# Patient Record
Sex: Male | Born: 1945 | Race: White | Hispanic: No | Smoking: Former smoker
Health system: Southern US, Community
[De-identification: ages and names within clinical notes are randomized; demographics above are authoritative.]

## PROBLEM LIST (undated history)

## (undated) DIAGNOSIS — I10 Essential (primary) hypertension: Secondary | ICD-10-CM

## (undated) DIAGNOSIS — N200 Calculus of kidney: Secondary | ICD-10-CM

## (undated) DIAGNOSIS — I2510 Atherosclerotic heart disease of native coronary artery without angina pectoris: Secondary | ICD-10-CM

## (undated) DIAGNOSIS — S129XXA Fracture of neck, unspecified, initial encounter: Secondary | ICD-10-CM

## (undated) DIAGNOSIS — E739 Lactose intolerance, unspecified: Secondary | ICD-10-CM

## (undated) DIAGNOSIS — K219 Gastro-esophageal reflux disease without esophagitis: Secondary | ICD-10-CM

## (undated) DIAGNOSIS — I219 Acute myocardial infarction, unspecified: Secondary | ICD-10-CM

## (undated) DIAGNOSIS — E785 Hyperlipidemia, unspecified: Secondary | ICD-10-CM

## (undated) DIAGNOSIS — N401 Enlarged prostate with lower urinary tract symptoms: Secondary | ICD-10-CM

## (undated) DIAGNOSIS — D500 Iron deficiency anemia secondary to blood loss (chronic): Secondary | ICD-10-CM

## (undated) DIAGNOSIS — Z9289 Personal history of other medical treatment: Secondary | ICD-10-CM

## (undated) DIAGNOSIS — I77810 Thoracic aortic ectasia: Secondary | ICD-10-CM

## (undated) DIAGNOSIS — Z87442 Personal history of urinary calculi: Secondary | ICD-10-CM

## (undated) DIAGNOSIS — C801 Malignant (primary) neoplasm, unspecified: Secondary | ICD-10-CM

## (undated) HISTORY — DX: Atherosclerotic heart disease of native coronary artery without angina pectoris: I25.10

## (undated) HISTORY — DX: Essential (primary) hypertension: I10

## (undated) HISTORY — PX: CORONARY STENT PLACEMENT: SHX1402

## (undated) HISTORY — DX: Iron deficiency anemia secondary to blood loss (chronic): D50.0

## (undated) HISTORY — DX: Lactose intolerance, unspecified: E73.9

## (undated) HISTORY — DX: Thoracic aortic ectasia: I77.810

## (undated) HISTORY — DX: Hyperlipidemia, unspecified: E78.5

## (undated) HISTORY — DX: Benign prostatic hyperplasia with lower urinary tract symptoms: N40.1

---

## 2003-09-04 ENCOUNTER — Inpatient Hospital Stay (HOSPITAL_COMMUNITY): Admission: EM | Admit: 2003-09-04 | Discharge: 2003-09-07 | Payer: Self-pay | Admitting: Emergency Medicine

## 2010-07-20 ENCOUNTER — Inpatient Hospital Stay (HOSPITAL_COMMUNITY)
Admission: EM | Admit: 2010-07-20 | Discharge: 2010-07-28 | DRG: 237 | Disposition: A | Discharge status: Home Health | Payer: Non-veteran care | Attending: Cardiovascular Disease | Admitting: Cardiovascular Disease

## 2010-07-20 ENCOUNTER — Inpatient Hospital Stay (HOSPITAL_COMMUNITY): Payer: Non-veteran care

## 2010-07-20 DIAGNOSIS — R570 Cardiogenic shock: Secondary | ICD-10-CM

## 2010-07-20 DIAGNOSIS — E7800 Pure hypercholesterolemia, unspecified: Secondary | ICD-10-CM | POA: Diagnosis present

## 2010-07-20 DIAGNOSIS — E876 Hypokalemia: Secondary | ICD-10-CM | POA: Diagnosis present

## 2010-07-20 DIAGNOSIS — Z87891 Personal history of nicotine dependence: Secondary | ICD-10-CM

## 2010-07-20 DIAGNOSIS — N179 Acute kidney failure, unspecified: Secondary | ICD-10-CM | POA: Diagnosis present

## 2010-07-20 DIAGNOSIS — E872 Acidosis, unspecified: Secondary | ICD-10-CM | POA: Diagnosis present

## 2010-07-20 DIAGNOSIS — L259 Unspecified contact dermatitis, unspecified cause: Secondary | ICD-10-CM | POA: Diagnosis not present

## 2010-07-20 DIAGNOSIS — J9600 Acute respiratory failure, unspecified whether with hypoxia or hypercapnia: Secondary | ICD-10-CM

## 2010-07-20 DIAGNOSIS — I252 Old myocardial infarction: Secondary | ICD-10-CM

## 2010-07-20 DIAGNOSIS — I2582 Chronic total occlusion of coronary artery: Secondary | ICD-10-CM | POA: Diagnosis present

## 2010-07-20 DIAGNOSIS — I469 Cardiac arrest, cause unspecified: Secondary | ICD-10-CM | POA: Diagnosis present

## 2010-07-20 DIAGNOSIS — I219 Acute myocardial infarction, unspecified: Secondary | ICD-10-CM

## 2010-07-20 DIAGNOSIS — I2510 Atherosclerotic heart disease of native coronary artery without angina pectoris: Secondary | ICD-10-CM | POA: Diagnosis present

## 2010-07-20 DIAGNOSIS — E785 Hyperlipidemia, unspecified: Secondary | ICD-10-CM | POA: Diagnosis present

## 2010-07-20 DIAGNOSIS — I2109 ST elevation (STEMI) myocardial infarction involving other coronary artery of anterior wall: Principal | ICD-10-CM | POA: Diagnosis present

## 2010-07-20 DIAGNOSIS — I10 Essential (primary) hypertension: Secondary | ICD-10-CM | POA: Diagnosis present

## 2010-07-20 DIAGNOSIS — I4901 Ventricular fibrillation: Secondary | ICD-10-CM | POA: Diagnosis present

## 2010-07-20 DIAGNOSIS — D649 Anemia, unspecified: Secondary | ICD-10-CM | POA: Diagnosis present

## 2010-07-20 DIAGNOSIS — N400 Enlarged prostate without lower urinary tract symptoms: Secondary | ICD-10-CM | POA: Diagnosis present

## 2010-07-20 LAB — BASIC METABOLIC PANEL
BUN: 17 mg/dL (ref 6–23)
BUN: 19 mg/dL (ref 6–23)
CO2: 23 mEq/L (ref 19–32)
Calcium: 6.7 mg/dL — ABNORMAL LOW (ref 8.4–10.5)
Chloride: 106 mEq/L (ref 96–112)
Creatinine, Ser: 1.71 mg/dL — ABNORMAL HIGH (ref 0.4–1.5)
Creatinine, Ser: 1.94 mg/dL — ABNORMAL HIGH (ref 0.4–1.5)
GFR calc Af Amer: 49 mL/min — ABNORMAL LOW (ref >60)
GFR calc Af Amer: 42 mL/min — ABNORMAL LOW (ref >60)
GFR calc non Af Amer: 41 mL/min — ABNORMAL LOW (ref >60)
Potassium: 3 mEq/L — ABNORMAL LOW (ref 3.5–5.1)

## 2010-07-20 LAB — GLUCOSE, CAPILLARY
Glucose-Capillary: 441 mg/dL — ABNORMAL HIGH (ref 70–99)
Glucose-Capillary: 454 mg/dL — ABNORMAL HIGH (ref 70–99)

## 2010-07-20 LAB — POCT I-STAT 3, ART BLOOD GAS (G3+)
Acid-base deficit: 7 mmol/L — ABNORMAL HIGH (ref 0.0–2.0)
O2 Saturation: 96 %
TCO2: 22 mmol/L (ref 0–100)

## 2010-07-20 LAB — CBC
HCT: 41.4 % (ref 39.0–52.0)
Hemoglobin: 14.6 g/dL (ref 13.0–17.0)
MCHC: 35.3 g/dL (ref 30.0–36.0)
MCV: 87.2 fL (ref 78.0–100.0)
Platelets: 327 10*3/uL (ref 150–400)
RBC: 4.75 MIL/uL (ref 4.22–5.81)
RDW: 13.3 % (ref 11.5–15.5)
WBC: 30.8 10*3/uL — ABNORMAL HIGH (ref 4.0–10.5)

## 2010-07-20 LAB — BLOOD GAS, ARTERIAL
Drawn by: 32470
FIO2: 0.8 %
O2 Saturation: 99.8 %
pCO2 arterial: 37.4 mmHg (ref 35.0–45.0)

## 2010-07-20 LAB — PHOSPHORUS: Phosphorus: 1.4 mg/dL — ABNORMAL LOW (ref 2.3–4.6)

## 2010-07-20 LAB — PROTIME-INR
INR: 1.16 (ref 0.00–1.49)
Prothrombin Time: 15 seconds (ref 11.6–15.2)

## 2010-07-20 LAB — APTT: aPTT: 136 seconds — ABNORMAL HIGH (ref 24–37)

## 2010-07-20 LAB — MAGNESIUM: Magnesium: 1.8 mg/dL (ref 1.5–2.5)

## 2010-07-20 LAB — HEPARIN LEVEL (UNFRACTIONATED): Heparin Unfractionated: 0.61 IU/mL (ref 0.30–0.70)

## 2010-07-21 ENCOUNTER — Inpatient Hospital Stay (HOSPITAL_COMMUNITY): Payer: Non-veteran care

## 2010-07-21 DIAGNOSIS — I2109 ST elevation (STEMI) myocardial infarction involving other coronary artery of anterior wall: Secondary | ICD-10-CM

## 2010-07-21 LAB — GLUCOSE, CAPILLARY
Glucose-Capillary: 137 mg/dL — ABNORMAL HIGH (ref 70–99)
Glucose-Capillary: 158 mg/dL — ABNORMAL HIGH (ref 70–99)
Glucose-Capillary: 167 mg/dL — ABNORMAL HIGH (ref 70–99)
Glucose-Capillary: 196 mg/dL — ABNORMAL HIGH (ref 70–99)
Glucose-Capillary: 235 mg/dL — ABNORMAL HIGH (ref 70–99)
Glucose-Capillary: 236 mg/dL — ABNORMAL HIGH (ref 70–99)
Glucose-Capillary: 266 mg/dL — ABNORMAL HIGH (ref 70–99)
Glucose-Capillary: 306 mg/dL — ABNORMAL HIGH (ref 70–99)
Glucose-Capillary: 313 mg/dL — ABNORMAL HIGH (ref 70–99)

## 2010-07-21 LAB — CBC
HCT: 34.1 % — ABNORMAL LOW (ref 39.0–52.0)
Hemoglobin: 11.9 g/dL — ABNORMAL LOW (ref 13.0–17.0)
WBC: 19.4 10*3/uL — ABNORMAL HIGH (ref 4.0–10.5)

## 2010-07-21 LAB — BASIC METABOLIC PANEL
BUN: 20 mg/dL (ref 6–23)
Calcium: 7 mg/dL — ABNORMAL LOW (ref 8.4–10.5)
GFR calc non Af Amer: 36 mL/min — ABNORMAL LOW (ref >60)
Potassium: 3.1 mEq/L — ABNORMAL LOW (ref 3.5–5.1)
Sodium: 142 mEq/L (ref 135–145)

## 2010-07-21 LAB — BLOOD GAS, ARTERIAL
Bicarbonate: 24.6 mEq/L — ABNORMAL HIGH (ref 20.0–24.0)
O2 Saturation: 99.4 %
Patient temperature: 100.6
pH, Arterial: 7.475 — ABNORMAL HIGH (ref 7.350–7.450)
pO2, Arterial: 143 mmHg — ABNORMAL HIGH (ref 80.0–100.0)

## 2010-07-21 LAB — LACTIC ACID, PLASMA: Lactic Acid, Venous: 2.3 mmol/L — ABNORMAL HIGH (ref 0.5–2.2)

## 2010-07-21 LAB — CARDIAC PANEL(CRET KIN+CKTOT+MB+TROPI)
CK, MB: 432.1 ng/mL (ref 0.3–4.0)
Total CK: 6609 U/L — ABNORMAL HIGH (ref 7–232)

## 2010-07-21 LAB — CARBOXYHEMOGLOBIN
O2 Saturation: 55 %
Total hemoglobin: 10.5 g/dL — ABNORMAL LOW (ref 13.5–18.0)

## 2010-07-21 LAB — CK TOTAL AND CKMB (NOT AT ARMC): CK, MB: 147.1 ng/mL (ref 0.3–4.0)

## 2010-07-21 LAB — HEPARIN LEVEL (UNFRACTIONATED): Heparin Unfractionated: 0.31 IU/mL (ref 0.30–0.70)

## 2010-07-21 LAB — MAGNESIUM: Magnesium: 1.7 mg/dL (ref 1.5–2.5)

## 2010-07-21 LAB — TROPONIN I: Troponin I: 91.29 ng/mL (ref 0.00–0.06)

## 2010-07-21 NOTE — Procedures (Signed)
NAMEDERWIN, REDDY                 ACCOUNT NO.:  [id number]  MEDICAL RECORD NO.:  [id number]           PATIENT TYPE:  I  LOCATION:  2902                         FACILITY:  [facility]  PHYSICIAN:  Verne Carrow, MDDATE OF BIRTH:  05/07/1946  DATE OF PROCEDURE:  07/20/2010 DATE OF DISCHARGE:                           CARDIAC CATHETERIZATION   PROCEDURES PERFORMED: 1. Salvage left heart catheterization. 2. Selective coronary angiography. 3. PTCA with placement of a bare-metal stent in the proximal left     anterior descending artery. 4. Thrombectomy of the proximal and mid left anterior descending     artery with an Expressway thrombectomy catheter. 5. Thrombectomy of the mid left anterior descending artery with an     AngioJet thrombectomy catheter. 6. Placement of an intra-aortic balloon pump in the left femoral     artery. 7. Placement of a temporary pacemaker in the right femoral vein.  OPERATOR:  Verne Carrow, MD  ARTERIAL ACCESS:  Right femoral artery for the catheterization.  Right femoral vein for the temporary pacemaker.  Left femoral artery for the balloon pump.  INDICATION:  This is a 65 year old Caucasian male with a history of coronary artery disease who called EMS around 11 a.m.  The patient had complaints of chest pain and inability to breathe.  The patient was approximately 45-50 minutes away from the [facility] when the EMS crew picked him up.  In route, the patient had several cardiac arrest with ventricular fibrillation requiring CPR and defibrillation. When the patient arrived in the [facility] Emergency Department, he was pulseless with ventricular fibrillation and CPR was in progress.  CPR was performed for approximately 20 minutes in the emergency department with multiple rounds of epinephrine as well as defibrillation.  After prolonged code in the emergency department, consideration was given to removal of care.  With one final shot,  the patient had return of idioventricular rhythm with a faint pulse.  We elected to bring him to the cath lab for a salvage left heart catheterization with possible percutaneous coronary intervention.  Upon arriving in the cath lab, the patient was pulseless again with ventricular fibrillation.  CPR was started and patient was shocked again.  Multiple rounds of epinephrine, atropine, and bicarbonate were given.  Initial pH was 6.9.  The patient was intubated and on the ventilator at this time.  The patient was started on dopamine 20 mcg/kg/minute and Levophed 15 mcg per minute.  He was also started on an epinephrine drip.  We were able to get a rhythm back here in the cath lab and elected to attempt a percutaneous coronary intervention.  Arterial access was obtained through the right femoral artery.  A 6-French sheath was placed without difficulty.  A 6-French XBLAD 3.5 guiding catheter was selectively engaged into the left main artery.  Angiography demonstrated total occlusion of the proximal left anterior descending artery.  The patient was given a bolus of heparin. He was given two boluses of Integrilin and started on a drip.  I was able to pass a Cougar intracoronary wire down the left anterior descending artery.  A 2.5 x 12-mm  balloon was inflated in the area of total occlusion twice.  Flow was reestablished into the left anterior descending artery, however, there was a heavy thrombus burden.  An Expressway wire thrombectomy catheter was passed several times down the vessel.  An AngioJet thrombectomy catheter was passed multiple times down the vessel.  A 3.0 x 15-mm Vision bare-metal stent was then deployed in the proximal left anterior descending artery.  A 3.25 x 12- mm noncompliant balloon was inflated inside the stented segment.  The proximal stenosis was taken from 100% to 0%.  At this time, there was flow down to the proximal portion of the distal segment of the LAD.   We reestablished flow into two diagonal branches and three septal perforating branches.  It appeared that there was some spasm of the distal vessel.  400 mcg of verapamil was given intracoronary.  400 mcg of nitroglycerin was given intracoronary.  With final angiography, there was excellent flow through the left anterior descending artery until there was abrupt occlusion throughout the diffusely diseased segment toward the apex of the vessel.  We elected to stop at this time.  The patient's blood pressure responded to the opening in the vessel. Epinephrine drip was stopped.  At this point, we performed angiography of the native right coronary artery.  We placed a pigtail catheter into the left ventricle to measure pressures.  Of note, an intra-aortic balloon pump was placed at the beginning portion of the diagnostic procedure.  This was maintained during the case.  The patient did not require any further defibrillatory shock after the percutaneous intervention was started.  The patient was responsive on the cath table, however was given some sedation.  Of note, a temporary pacer was also placed into the right ventricle before the AngioJet thrombectomy was performed.  His temporary pacemaker was removed before the patient left cath lab.  HEMODYNAMIC FINDINGS:  Central aortic pressure 55/40.  Left ventricular pressure 72/21.  Left ventricular end-diastolic pressure 32.  ANGIOGRAPHIC FINDINGS: 1. The left main coronary artery had no disease. 2. The left anterior descending had 100% proximal occlusion. 3. The circumflex artery gave off two moderate-sized obtuse marginal     branches.  There was diffuse plaque, but no heavily stenotic     lesions. 4. The right coronary artery was a very large dominant vessel with     serial 30% lesions throughout the proximal and mid vessel. 5. No left ventricular angiogram was performed.  IMPRESSION: 1. Cardiac arrest with prolonged cardiopulmonary  resuscitation     secondary to anterior ST-segment elevation myocardial infarction. 2. Occluded proximal left anterior descending artery. 3. Successful salvage percutaneous transluminal coronary angioplasty     with placement of bare-metal stent in the left anterior descending     artery. 4. Cardiogenic shock.  RECOMMENDATIONS:  The patient will be taken to the CCU.  We will continue his intra-aortic balloon pump at 1:1.  We will continue his Levophed and dopamine drips.  We will continue his sodium bicarbonate drip.  We will load him with 60 mg of Effient through his NG tube.  He will be continued on aspirin 325 mg once daily and will be given a statin medication.  We will not start a beta-blocker at this time with his hemodynamic instability.  Pulmonary Critical Care Medicine has been consulted for potential cooling protocol, although this will have to be well thought out as the patient currently seems to be somewhat responsive.     Verne Carrow, MD  CM/MEDQ  D:  07/20/2010  T:  07/21/2010  Job:  [id number]  Electronically Signed by Verne Carrow MD on 07/21/2010 05:42:22 PM

## 2010-07-22 ENCOUNTER — Inpatient Hospital Stay (HOSPITAL_COMMUNITY): Payer: Non-veteran care

## 2010-07-22 DIAGNOSIS — I2109 ST elevation (STEMI) myocardial infarction involving other coronary artery of anterior wall: Secondary | ICD-10-CM

## 2010-07-22 LAB — POCT I-STAT 3, ART BLOOD GAS (G3+)
Bicarbonate: 27.4 mEq/L — ABNORMAL HIGH (ref 20.0–24.0)
O2 Saturation: 97 %
Patient temperature: 98.9
Patient temperature: 97.2
TCO2: 29 mmol/L (ref 0–100)
pCO2 arterial: 30.3 mmHg — ABNORMAL LOW (ref 35.0–45.0)
pH, Arterial: 7.58 — ABNORMAL HIGH (ref 7.350–7.450)
pH, Arterial: 7.548 — ABNORMAL HIGH (ref 7.350–7.450)

## 2010-07-22 LAB — BASIC METABOLIC PANEL
BUN: 17 mg/dL (ref 6–23)
BUN: 21 mg/dL (ref 6–23)
CO2: 28 mEq/L (ref 19–32)
Calcium: 6.5 mg/dL — ABNORMAL LOW (ref 8.4–10.5)
Chloride: 104 mEq/L (ref 96–112)
Creatinine, Ser: 1.34 mg/dL (ref 0.4–1.5)
Creatinine, Ser: 1.32 mg/dL (ref 0.4–1.5)
GFR calc Af Amer: >60 mL/min (ref >60)
GFR calc non Af Amer: 54 mL/min — ABNORMAL LOW (ref >60)
Glucose, Bld: 137 mg/dL — ABNORMAL HIGH (ref 70–99)

## 2010-07-22 LAB — CBC
HCT: 27.6 % — ABNORMAL LOW (ref 39.0–52.0)
RDW: 13.8 % (ref 11.5–15.5)
WBC: 15.6 10*3/uL — ABNORMAL HIGH (ref 4.0–10.5)

## 2010-07-22 LAB — HEMOGLOBIN A1C
Hgb A1c MFr Bld: 6 % — ABNORMAL HIGH (ref <5.7)
Mean Plasma Glucose: 126 mg/dL — ABNORMAL HIGH (ref <117)

## 2010-07-22 LAB — GLUCOSE, CAPILLARY
Glucose-Capillary: 114 mg/dL — ABNORMAL HIGH (ref 70–99)
Glucose-Capillary: 132 mg/dL — ABNORMAL HIGH (ref 70–99)
Glucose-Capillary: 137 mg/dL — ABNORMAL HIGH (ref 70–99)
Glucose-Capillary: 138 mg/dL — ABNORMAL HIGH (ref 70–99)
Glucose-Capillary: 138 mg/dL — ABNORMAL HIGH (ref 70–99)
Glucose-Capillary: 139 mg/dL — ABNORMAL HIGH (ref 70–99)
Glucose-Capillary: 140 mg/dL — ABNORMAL HIGH (ref 70–99)
Glucose-Capillary: 148 mg/dL — ABNORMAL HIGH (ref 70–99)
Glucose-Capillary: 149 mg/dL — ABNORMAL HIGH (ref 70–99)
Glucose-Capillary: 154 mg/dL — ABNORMAL HIGH (ref 70–99)
Glucose-Capillary: 158 mg/dL — ABNORMAL HIGH (ref 70–99)
Glucose-Capillary: 159 mg/dL — ABNORMAL HIGH (ref 70–99)
Glucose-Capillary: 164 mg/dL — ABNORMAL HIGH (ref 70–99)

## 2010-07-22 LAB — MAGNESIUM: Magnesium: 1.6 mg/dL (ref 1.5–2.5)

## 2010-07-22 LAB — PHOSPHORUS: Phosphorus: 3.1 mg/dL (ref 2.3–4.6)

## 2010-07-23 DIAGNOSIS — I214 Non-ST elevation (NSTEMI) myocardial infarction: Secondary | ICD-10-CM

## 2010-07-23 LAB — POCT I-STAT 3, ART BLOOD GAS (G3+)
Bicarbonate: 25.3 mEq/L — ABNORMAL HIGH (ref 20.0–24.0)
O2 Saturation: 82 %
Patient temperature: 98.6
TCO2: 13 mmol/L (ref 0–100)
pCO2 arterial: 53.1 mmHg — ABNORMAL HIGH (ref 35.0–45.0)
pCO2 arterial: 71.2 mmHg (ref 35.0–45.0)
pH, Arterial: 6.913 — CL (ref 7.350–7.450)
pH, Arterial: 6.95 — CL (ref 7.350–7.450)
pH, Arterial: 7.472 — ABNORMAL HIGH (ref 7.350–7.450)
pO2, Arterial: 76 mmHg — ABNORMAL LOW (ref 80.0–100.0)
pO2, Arterial: 69 mmHg — ABNORMAL LOW (ref 80.0–100.0)

## 2010-07-23 LAB — GLUCOSE, CAPILLARY
Glucose-Capillary: 133 mg/dL — ABNORMAL HIGH (ref 70–99)
Glucose-Capillary: 94 mg/dL (ref 70–99)
Glucose-Capillary: 104 mg/dL — ABNORMAL HIGH (ref 70–99)

## 2010-07-23 LAB — CBC
Hemoglobin: 8.3 g/dL — ABNORMAL LOW (ref 13.0–17.0)
MCH: 30.1 pg (ref 26.0–34.0)
Platelets: 148 10*3/uL — ABNORMAL LOW (ref 150–400)
RBC: 2.76 MIL/uL — ABNORMAL LOW (ref 4.22–5.81)
WBC: 14.1 10*3/uL — ABNORMAL HIGH (ref 4.0–10.5)

## 2010-07-23 LAB — COMPREHENSIVE METABOLIC PANEL
ALT: 169 U/L — ABNORMAL HIGH (ref 0–53)
Albumin: 2.4 g/dL — ABNORMAL LOW (ref 3.5–5.2)
Alkaline Phosphatase: 42 U/L (ref 39–117)
BUN: 19 mg/dL (ref 6–23)
Calcium: 6.5 mg/dL — ABNORMAL LOW (ref 8.4–10.5)
Potassium: 3.2 mEq/L — ABNORMAL LOW (ref 3.5–5.1)
Sodium: 136 mEq/L (ref 135–145)
Total Protein: 4.6 g/dL — ABNORMAL LOW (ref 6.0–8.3)

## 2010-07-23 LAB — MAGNESIUM: Magnesium: 1.8 mg/dL (ref 1.5–2.5)

## 2010-07-23 LAB — POCT ACTIVATED CLOTTING TIME: Activated Clotting Time: 116 seconds

## 2010-07-23 LAB — PHOSPHORUS: Phosphorus: 2.2 mg/dL — ABNORMAL LOW (ref 2.3–4.6)

## 2010-07-23 LAB — HEPARIN LEVEL (UNFRACTIONATED): Heparin Unfractionated: <0.1 IU/mL — ABNORMAL LOW (ref 0.30–0.70)

## 2010-07-24 ENCOUNTER — Inpatient Hospital Stay (HOSPITAL_COMMUNITY): Payer: Non-veteran care

## 2010-07-24 LAB — GLUCOSE, CAPILLARY
Glucose-Capillary: 103 mg/dL — ABNORMAL HIGH (ref 70–99)
Glucose-Capillary: 110 mg/dL — ABNORMAL HIGH (ref 70–99)
Glucose-Capillary: 110 mg/dL — ABNORMAL HIGH (ref 70–99)
Glucose-Capillary: 124 mg/dL — ABNORMAL HIGH (ref 70–99)
Glucose-Capillary: 98 mg/dL (ref 70–99)

## 2010-07-24 LAB — COMPREHENSIVE METABOLIC PANEL
ALT: 115 U/L — ABNORMAL HIGH (ref 0–53)
AST: 79 U/L — ABNORMAL HIGH (ref 0–37)
Albumin: 2.4 g/dL — ABNORMAL LOW (ref 3.5–5.2)
CO2: 24 mEq/L (ref 19–32)
Calcium: 7.4 mg/dL — ABNORMAL LOW (ref 8.4–10.5)
Chloride: 108 mEq/L (ref 96–112)
Creatinine, Ser: 1.07 mg/dL (ref 0.4–1.5)
GFR calc Af Amer: >60 mL/min (ref >60)
GFR calc non Af Amer: >60 mL/min (ref >60)
Sodium: 141 mEq/L (ref 135–145)

## 2010-07-24 LAB — CBC
Hemoglobin: 8.1 g/dL — ABNORMAL LOW (ref 13.0–17.0)
MCHC: 34.8 g/dL (ref 30.0–36.0)
Platelets: 141 10*3/uL — ABNORMAL LOW (ref 150–400)
RBC: 2.63 MIL/uL — ABNORMAL LOW (ref 4.22–5.81)

## 2010-07-25 DIAGNOSIS — I469 Cardiac arrest, cause unspecified: Secondary | ICD-10-CM

## 2010-07-25 LAB — COMPREHENSIVE METABOLIC PANEL
ALT: 90 U/L — ABNORMAL HIGH (ref 0–53)
BUN: 16 mg/dL (ref 6–23)
CO2: 24 mEq/L (ref 19–32)
Calcium: 7.8 mg/dL — ABNORMAL LOW (ref 8.4–10.5)
Creatinine, Ser: 1.02 mg/dL (ref 0.4–1.5)
GFR calc non Af Amer: >60 mL/min (ref >60)
Glucose, Bld: 84 mg/dL (ref 70–99)
Total Protein: 5.2 g/dL — ABNORMAL LOW (ref 6.0–8.3)

## 2010-07-25 LAB — CBC
HCT: 23.8 % — ABNORMAL LOW (ref 39.0–52.0)
Hemoglobin: 8.1 g/dL — ABNORMAL LOW (ref 13.0–17.0)
MCH: 29.9 pg (ref 26.0–34.0)
MCHC: 34 g/dL (ref 30.0–36.0)
MCV: 87.8 fL (ref 78.0–100.0)
RDW: 13.9 % (ref 11.5–15.5)

## 2010-07-25 LAB — FERRITIN: Ferritin: 416 ng/mL — ABNORMAL HIGH (ref 22–322)

## 2010-07-25 LAB — PHOSPHORUS: Phosphorus: 2.5 mg/dL (ref 2.3–4.6)

## 2010-07-25 LAB — FOLATE: Folate: 5.9 ng/mL

## 2010-07-25 LAB — IRON AND TIBC: Saturation Ratios: 17 % — ABNORMAL LOW (ref 20–55)

## 2010-07-25 LAB — MAGNESIUM: Magnesium: 2.1 mg/dL (ref 1.5–2.5)

## 2010-07-26 LAB — BASIC METABOLIC PANEL
BUN: 15 mg/dL (ref 6–23)
Chloride: 107 mEq/L (ref 96–112)
GFR calc non Af Amer: >60 mL/min (ref >60)
Glucose, Bld: 95 mg/dL (ref 70–99)
Potassium: 3.5 mEq/L (ref 3.5–5.1)
Sodium: 138 mEq/L (ref 135–145)

## 2010-07-26 LAB — LIPID PANEL
Cholesterol: 116 mg/dL (ref 0–200)
LDL Cholesterol: 54 mg/dL (ref 0–99)
Total CHOL/HDL Ratio: 3.3 RATIO
Triglycerides: 133 mg/dL (ref <150)

## 2010-07-27 LAB — CBC
Hemoglobin: 8.6 g/dL — ABNORMAL LOW (ref 13.0–17.0)
MCH: 29.9 pg (ref 26.0–34.0)
MCHC: 33.9 g/dL (ref 30.0–36.0)
RDW: 14.8 % (ref 11.5–15.5)

## 2010-07-28 LAB — CBC
MCH: 30 pg (ref 26.0–34.0)
MCV: 88.5 fL (ref 78.0–100.0)
Platelets: 338 10*3/uL (ref 150–400)
RDW: 15.2 % (ref 11.5–15.5)
WBC: 8.8 10*3/uL (ref 4.0–10.5)

## 2010-07-28 LAB — BASIC METABOLIC PANEL
BUN: 14 mg/dL (ref 6–23)
Creatinine, Ser: 1.02 mg/dL (ref 0.4–1.5)
GFR calc non Af Amer: >60 mL/min (ref >60)
Potassium: 3.4 mEq/L — ABNORMAL LOW (ref 3.5–5.1)

## 2010-07-28 LAB — BRAIN NATRIURETIC PEPTIDE: Pro B Natriuretic peptide (BNP): 255 pg/mL — ABNORMAL HIGH (ref 0.0–100.0)

## 2010-08-01 ENCOUNTER — Telehealth: Payer: Self-pay | Admitting: Cardiovascular Disease

## 2010-08-01 ENCOUNTER — Encounter: Payer: Self-pay | Admitting: Cardiovascular Disease

## 2010-08-09 NOTE — H&P (Signed)
NAMEJOSON, [name]                 ACCOUNT NO.:  [id number]  MEDICAL RECORD NO.:  [id number]           PATIENT TYPE:  I  LOCATION:  2902                         FACILITY:  [facility]  PHYSICIAN:  Veverly Fells. Excell Seltzer, MD  DATE OF BIRTH:  01-31-1946  DATE OF ADMISSION:  07/20/2010 DATE OF DISCHARGE:                             HISTORY & PHYSICAL   HISTORY OF PRESENT ILLNESS:  This is a 65 year old gentleman with a history of coronary artery disease and prior MI in approximately 2004 who developed sudden onset of chest pain and shortness of breath this morning.  All of the history is obtained from his family.  The patient called 911 and upon EMS arrival, the patient had severe chest pain.  He had tombstone ST elevation on his EKG suggestive of massive anterior wall MI.  A code STEMI was called from the field and the patient was transported by EMS.  The patient developed ventricular fibrillation en route and required 2 shocks.  CPR was in progress when the patient arrived to [facility] while he was having of chest compressions.  We met the patient as he was coming off the truck and delivered another shock before he was brought into the emergency department.  CPR continued through this time.  The patient was given multiple rounds of epinephrine as well as continued CPR and multiple defibrillations.  We had a discussion with the patient's brother who was the only family member present and felt that the patient's prognosis was extremely poor at that point.  The patient was delivered a final shock and he developed a ventricular escape rhythm with a pulse.  We brought him emergently to the cath lab for a salvage procedure.  This was discussed with the patient's brother and sister-in-law while he was on the way up to the cath lab.  Dr. Clifton James escorted him to the cath lab where CPR was continued.  The patient will undergo salvage cardiac catheterization as he is clearly having a massive  anterior wall myocardial infarction.  PAST MEDICAL HISTORY: 1. CAD status post MI 2004.  Apparently the patient was treated     medically because of "small blood vessels." 2. Hypercholesterolemia. 3. Hypertension.  MEDICATIONS:  The patient's medications are unknown at this time.  He takes 3 blood pressure medicines, 1 cholesterol medicine, antacid, and a baby aspirin daily.  The specific meds are not known.  ALLERGIES:  NKDA.  SOCIAL HISTORY:  The patient works part-time at a funeral home.  He quit smoking at the time of his first MI.  He drinks alcohol on rare occasion.  FAMILY HISTORY:  His brother recently had a TIA.  His dad had an MI and stroke.  REVIEW OF SYSTEMS:  Unable to obtain.  PHYSICAL EXAMINATION:  The patient is pulseless.  He has no spontaneous respirations.  CPR is in progress.  He is cyanotic appearing.  There is no peripheral edema.  ELECTROCARDIOGRAM:  The patient initially had marked anterolateral ST- segment elevation consistent with massive myocardial infarction, again on arrival, the patient has coarse ventricular fibrillation.  ASSESSMENT:  This is a  65 year old gentleman with ventricular fibrillation arrest and pulseless electrical activity following massive anterolateral myocardial infarction with CPR in progress.  As above, the patient is being taken for salvage cardiac catheterization plus or minus PCI.  All this was discussed in detail with the patient's family. Greater than 1 hour was spent with the patient and critical care time.     Veverly Fells. Excell Seltzer, MD     MDC/MEDQ  D:  07/20/2010  T:  07/20/2010  Job:  [id number]  Electronically Signed by Tonny Bollman MD on 08/07/2010 09:10:20 PM

## 2010-08-09 NOTE — Progress Notes (Signed)
Summary: orders for home health care  Phone Note Call from Patient Call back at [phone]   Caller:  Andrea--friend of pt Summary of Call: Pt calling about in home health care Initial call taken by: Judie Grieve,  August 01, 2010 3:53 PM  Follow-up for Phone Call        Sue Lush called to let us know that [name] would not accept the pt due to insurance.  I did not release any medical information but made her aware that I would refer pt to another Home Health Agency.  Paperwork completed and faxed to [facility] for PT/OT.  Follow-up by: Julieta Gutting, RN, BSN,  August 01, 2010 6:53 PM

## 2010-08-10 NOTE — Discharge Summary (Signed)
[name], Parker                 ACCOUNT NO.:  [id number]  MEDICAL RECORD NO.:  [id number]           PATIENT TYPE:  I  LOCATION:  3707                         FACILITY:  [facility]  PHYSICIAN:  Noralyn Pick. Eden Emms, MD, FACCDATE OF BIRTH:  April 29, 1946  DATE OF ADMISSION:  07/20/2010 DATE OF DISCHARGE:  07/28/2010                              DISCHARGE SUMMARY   PRIMARY CARDIOLOGIST:  Veverly Fells. Excell Seltzer, MD.  Primary care was provided at the [facility].  DISCHARGE DIAGNOSES:  Acute anterolateral ST-segment elevation myocardial infarction.  SECONDARY DIAGNOSES: 1. Ventricular fibrillation/pulseless electrical activity arrest,     requiring prolonged CPR. 2. Cardiogenic shock requiring intraaortic balloon pump and inotropic     therapy. 3. Ventilator-dependent respiratory failure requiring intubation, now     resolved. 4. Nonoliguric acute renal failure in the setting of cardiogenic     shock. 5. Coronary artery disease with an occluded proximal LAD in this     admission successfully treated with salvage thrombectomy, PTCA, and     placement of a 3.0 x 15 mm Vision bare-metal stent. 6. Hypotension requiring inotropic support. 7. Hypokalemia requiring supplementation. 8. Hyperlipidemia. 9. Prior history of hypertension.  ALLERGIES:  No known drug allergies.  PROCEDURES: 1. Emergent diagnostic cardiac catheterization, July 20, 2010,     revealing an occluded proximal LAD with otherwise nonobstructive     disease.  The LAD was successfully treated with thrombectomy, PTCA,     and placement of 3.0 x 15 mm Vision bare-metal stent.  Intra-aortic     balloon pump was placed as well as a temporary transvenous     pacemaker. 2. 2-D echocardiogram, July 25, 2008.  EF 55%.  There was overall     poor endocardial visualization.  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  A     small primarily posterior pericardial effusion with no obvious     tamponade.  HISTORY OF PRESENT  ILLNESS:  A 65 year old male with prior history of MI, small vessel of coronary artery disease, who was in his usual state of health until the morning of admission when the patient had sudden onset of chest pain and dyspnea, prompting him to call 911.  Upon EMS arrival, the patient was noted to have a marked ST-segment elevation in anterolateral leads and a code STEMI was called.  The patient unfortunately developed ventricular fibrillation requiring two defibrillations as well as CPR on arrival to [facility].  The patient had recurrent ventricular fibrillation immediately upon arrival, [facility] requiring an additional defibrillation with ongoing CPR.  He received multiple rounds of epinephrine and multiple additional defibrillations.  It was felt that the patient's prognosis was poor and discussion was held with family members.  Decision was made to give one additional defibrillation, which resulted in a ventricular escape rhythm with a pulse.  This being the case, the patient was taken emergently to the Cath Lab for further evaluation.  HOSPITAL COURSE:  The patient underwent emergent diagnostic catheterization on July 20, 2010.  Of note, the patient had been intubated and sedated earlier.  Catheterization revealed subtle occlusion of the proximal  LAD, otherwise nonobstructive disease. Salvage intervention was performed with eventual placement of a 3.0 x 15 mm Vision bare-metal stent in the LAD with good angiographic result and restoration of distal flow.  Prior to leaving the Cath Lab, an intra- aortic balloon pump was placed in a ratio 1:1 and the patient was also maintained on Levophed, dopamine, and sodium bicarbonate drips.  The patient was monitored in the Coronary Intensive Care Unit and Critical Care Medicine was consulted for additional ventilator management.  He was maintained on aspirin, Effient, and eptifibatide infusion.  The eptifibatide was subsequently  discontinued on July 21, 2010.  Within 48 hours, the patient's blood pressure stabilized to the point that both norepinephrine and dopamine therapy were discontinued.  Further, his respiratory status improved and the patient was extubated on July 22, 2010.  Intra-aortic balloon pump was initially weaned down to 1-2 and then subsequently discontinued on July 23, 2010.  With hemodynamic stabilization, we are able to initiate low-dose enalapril therapy and secondary to presumed cardiomyopathy in the setting of a massive anterolateral MI, he is also placed on intravenous digoxin and oral spirolactone.  Low-dose beta- blocker was also initiated.  Followup 2-D echocardiogram was performed on July 25, 2010 showing an EF of 55%.  Overall, there was poor endocardial visualization and the apex appeared to be akinetic.  In light of normal LV function, digoxin and spirolactone were discontinued. The patient's enalapril was switched back to an oral formulation of losartan, which the patient was previously taking at home.  Carvedilol therapy was continued along with statin.  In the setting of acute MI, the patient did initially exhibited acute nonoliguric renal failure with the creatinine of 1.94 on July 20, 2010.  This has been improving on a daily basis and is 1.02 today. Further, the patient has been hypokalemic throughout admission requiring supplementation.  Finally, the patient has been anemic with an hemoglobin and hematocrit in the 8 and 24 range.  He has not required transfusion.  Anemia is felt to be multifactorial in the setting of blood loss with prolonged procedure, critical illness, and phlebotomy. He has been placed on oral iron supplementation.  The patient was transferred out to the floor and on July 26, 2010. He has been evaluated by Cardiac Rehab and has been ambulating without recurrent symptoms or limitations.  The patient is felt to be stable  for discharge today.  DISCHARGE LABS:  Hemoglobin 8.6, hematocrit 25.4, WBC 8.8, platelets 338.  Sodium 139, potassium 3.4, chloride 110, CO2 of 24, BUN 14, creatinine 1.02, glucose 106, total bilirubin 0.8, alkaline phosphatase 58, AST 59, ALT 90, total protein 5.2, albumin 2.4, calcium 8.1, phosphorus 2.5, magnesium 2.1.  Procalcitonin was 9.39 on July 21, 2010.  Hemoglobin A1c 6.0, peak CK 6609, peak MB 289.9, peak troponin-I greater than 100, total cholesterol 116, triglycerides 133, HDL 35, LDL 54.  Serum iron 39, TIBC 225, UIBC 186, vitamin B12 of 164, folate 5.9, ferritin 416.  MRSA screen was negative.  DISPOSITION:  The patient will be discharged home today in good condition.  FOLLOWUP PLANS AND APPOINTMENTS:  We will arrange for followup with Dr. Tonny Bollman in approximately 2 weeks.  The patient will follow up with [facility] as previously scheduled.  He will require follow up H and H when seen in our office.  DISCHARGE MEDICATIONS: 1. Carvedilol 6.25 mg b.i.d. 2. Ferrous sulfate 325 mg daily. 3. Hydrocodone/APAP 5/325 mg q.6 h p.r.n. chest wall pain. 4. Nitroglycerin 0.4  mg SL prn chest pain. 5. Effient 10 mg daily. 6. Losartan 50 mg daily. 7. Aspirin 81 mg daily. 8. Omeprazole 20 mg daily. 9. Pravastatin 40 mg at bedtime. 10.Potassium chloride 20 mEq daily.  OUTSTANDING LABORATORY STUDIES:  The patient will require follow up basic metabolic panel and CBC when seen.  DURATION OF DISCHARGE/ENCOUNTER:  Sixty minutes including physician time.     Nicolasa Ducking, ANP   ______________________________ Noralyn Pick. Eden Emms, MD, [facility]    CB/MEDQ  D:  07/28/2010  T:  07/28/2010  Job:  [id number]  cc:   [facility]  Electronically Signed by Nicolasa Ducking ANP on 08/07/2010 04:12:09 PM Electronically Signed by Charlton Haws MD [facility] on 08/09/2010 09:07:24 AM

## 2010-08-13 DIAGNOSIS — E876 Hypokalemia: Secondary | ICD-10-CM | POA: Insufficient documentation

## 2010-08-13 DIAGNOSIS — N138 Other obstructive and reflux uropathy: Secondary | ICD-10-CM

## 2010-08-13 DIAGNOSIS — N401 Enlarged prostate with lower urinary tract symptoms: Secondary | ICD-10-CM | POA: Insufficient documentation

## 2010-08-13 DIAGNOSIS — I959 Hypotension, unspecified: Secondary | ICD-10-CM | POA: Insufficient documentation

## 2010-08-13 DIAGNOSIS — Z8679 Personal history of other diseases of the circulatory system: Secondary | ICD-10-CM | POA: Insufficient documentation

## 2010-08-13 DIAGNOSIS — I4901 Ventricular fibrillation: Secondary | ICD-10-CM | POA: Insufficient documentation

## 2010-08-13 DIAGNOSIS — E785 Hyperlipidemia, unspecified: Secondary | ICD-10-CM

## 2010-08-13 DIAGNOSIS — I2510 Atherosclerotic heart disease of native coronary artery without angina pectoris: Secondary | ICD-10-CM

## 2010-08-13 DIAGNOSIS — R570 Cardiogenic shock: Secondary | ICD-10-CM | POA: Insufficient documentation

## 2010-08-13 HISTORY — DX: Benign prostatic hyperplasia with lower urinary tract symptoms: N40.1

## 2010-08-13 HISTORY — DX: Hyperlipidemia, unspecified: E78.5

## 2010-08-13 HISTORY — DX: Other obstructive and reflux uropathy: N13.8

## 2010-08-13 HISTORY — DX: Atherosclerotic heart disease of native coronary artery without angina pectoris: I25.10

## 2010-08-14 ENCOUNTER — Emergency Department (HOSPITAL_COMMUNITY): Payer: Non-veteran care

## 2010-08-14 ENCOUNTER — Encounter: Payer: Self-pay | Admitting: Physician Assistant

## 2010-08-14 ENCOUNTER — Inpatient Hospital Stay (HOSPITAL_COMMUNITY)
Admission: EM | Admit: 2010-08-14 | Discharge: 2010-08-15 | DRG: 312 | Disposition: A | Discharge status: Home / Self Care | Payer: Non-veteran care | Attending: Cardiovascular Disease | Admitting: Cardiovascular Disease

## 2010-08-14 ENCOUNTER — Other Ambulatory Visit (INDEPENDENT_AMBULATORY_CARE_PROVIDER_SITE_OTHER): Payer: Medicare Other

## 2010-08-14 ENCOUNTER — Encounter (INDEPENDENT_AMBULATORY_CARE_PROVIDER_SITE_OTHER): Payer: Medicare Other | Admitting: Physician Assistant

## 2010-08-14 ENCOUNTER — Inpatient Hospital Stay (HOSPITAL_COMMUNITY): Payer: Non-veteran care

## 2010-08-14 DIAGNOSIS — E739 Lactose intolerance, unspecified: Secondary | ICD-10-CM | POA: Insufficient documentation

## 2010-08-14 DIAGNOSIS — Z79899 Other long term (current) drug therapy: Secondary | ICD-10-CM

## 2010-08-14 DIAGNOSIS — N139 Obstructive and reflux uropathy, unspecified: Secondary | ICD-10-CM | POA: Diagnosis present

## 2010-08-14 DIAGNOSIS — I2510 Atherosclerotic heart disease of native coronary artery without angina pectoris: Secondary | ICD-10-CM

## 2010-08-14 DIAGNOSIS — D500 Iron deficiency anemia secondary to blood loss (chronic): Secondary | ICD-10-CM

## 2010-08-14 DIAGNOSIS — I2109 ST elevation (STEMI) myocardial infarction involving other coronary artery of anterior wall: Secondary | ICD-10-CM

## 2010-08-14 DIAGNOSIS — N138 Other obstructive and reflux uropathy: Secondary | ICD-10-CM | POA: Diagnosis present

## 2010-08-14 DIAGNOSIS — Z7982 Long term (current) use of aspirin: Secondary | ICD-10-CM

## 2010-08-14 DIAGNOSIS — I10 Essential (primary) hypertension: Secondary | ICD-10-CM

## 2010-08-14 DIAGNOSIS — E785 Hyperlipidemia, unspecified: Secondary | ICD-10-CM | POA: Diagnosis present

## 2010-08-14 DIAGNOSIS — R0989 Other specified symptoms and signs involving the circulatory and respiratory systems: Secondary | ICD-10-CM

## 2010-08-14 DIAGNOSIS — N401 Enlarged prostate with lower urinary tract symptoms: Secondary | ICD-10-CM | POA: Diagnosis present

## 2010-08-14 DIAGNOSIS — Z9861 Coronary angioplasty status: Secondary | ICD-10-CM

## 2010-08-14 DIAGNOSIS — R55 Syncope and collapse: Principal | ICD-10-CM | POA: Diagnosis present

## 2010-08-14 DIAGNOSIS — R7309 Other abnormal glucose: Secondary | ICD-10-CM | POA: Diagnosis present

## 2010-08-14 HISTORY — DX: Essential (primary) hypertension: I10

## 2010-08-14 HISTORY — DX: Lactose intolerance, unspecified: E73.9

## 2010-08-14 HISTORY — DX: Iron deficiency anemia secondary to blood loss (chronic): D50.0

## 2010-08-14 LAB — CARDIAC PANEL(CRET KIN+CKTOT+MB+TROPI): Relative Index: INVALID (ref 0.0–2.5)

## 2010-08-14 LAB — DIFFERENTIAL
Basophils Absolute: 0 10*3/uL (ref 0.0–0.1)
Basophils Relative: 0 % (ref 0–1)
Eosinophils Absolute: 0.2 10*3/uL (ref 0.0–0.7)
Eosinophils Relative: 1 % (ref 0–5)

## 2010-08-14 LAB — APTT: aPTT: 27 seconds (ref 24–37)

## 2010-08-14 LAB — PROTIME-INR: INR: 1.05 (ref 0.00–1.49)

## 2010-08-14 LAB — COMPREHENSIVE METABOLIC PANEL
AST: 19 U/L (ref 0–37)
BUN: 13 mg/dL (ref 6–23)
CO2: 25 mEq/L (ref 19–32)
Chloride: 106 mEq/L (ref 96–112)
Creatinine, Ser: 0.94 mg/dL (ref 0.4–1.5)
GFR calc Af Amer: >60 mL/min (ref >60)
GFR calc non Af Amer: >60 mL/min (ref >60)
Total Bilirubin: 0.8 mg/dL (ref 0.3–1.2)

## 2010-08-14 LAB — CBC
Platelets: 315 10*3/uL (ref 150–400)
RDW: 14.2 % (ref 11.5–15.5)
WBC: 11.5 10*3/uL — ABNORMAL HIGH (ref 4.0–10.5)

## 2010-08-15 DIAGNOSIS — R55 Syncope and collapse: Secondary | ICD-10-CM

## 2010-08-15 LAB — CARDIAC PANEL(CRET KIN+CKTOT+MB+TROPI)
CK, MB: 1.3 ng/mL (ref 0.3–4.0)
Troponin I: 0.03 ng/mL (ref 0.00–0.06)

## 2010-08-21 NOTE — Assessment & Plan Note (Signed)
Summary: post hospital   Visit Type:  ov   History of Present Illness: Primary Cardiologist:  Dr. Tonny Bollman  [name] is a 65 yo male with a history of CAD, status post MI in 2005 treated medically.  He presented to [facility] on 07/20/10 with an AL STEMI complicated by VFib arrest/PEA requiring prolonged CPR, cardiogenic shock, ventilator dependent respiratory failure, nonoliguric acute renal failure.  He was treated with IABP/inotropes.  After prolonged attempts at reviving him, he did develop a stable rhythm and was taken to the Cath Lab for salvage PCI.  He had a totally occluded LAD that was treated with PTCA/thrombectomy and bare-metal stent.  EF was noted to be preserved on followup echocardiogram.  Digoxin and spironolactone were both discontinued at this point.  He returns for followup.  Labs: Na 139, K 3.4, Creat 1.02, Hgb 8.6, MCV 88.5, TC 116, TG 133, HDL 35, LDL 54, AST 59, ALT 90, A1C 6, Fe 39, B12 164 (low), Folate 5.6 (ok).  He was put on Iron in the hospital.  It was thought he had multifactorial anemia, partly related to blood loss from prolonged cath and multiple blood draws.  He also required K+ repletion during his stay.  Since discharge, he is done well.  He denies any further chest discomfort.  He denies shortness of breath.  He does have some bilateral arm pain.  This is from his elbows to his wrists.  He is working with physical therapy at home.  He feels like his arms are somewhat sore.  His chest is still sore from prolonged CPR.  He denies orthopnea, PND or edema.  He denies syncope or palpitations.   Current Medications (verified): 1)  Coreg 6.25 Mg Tabs (Carvedilol) .Marland Kitchen.. 1 By Mouth Two Times A Day 2)  Iron 325 (65 Fe) Mg Tabs (Ferrous Sulfate) .Marland Kitchen.. 1 By Mouth Daily 3)  Hydrocodone-Acetaminophen 5-325 Mg Tabs (Hydrocodone-Acetaminophen) .... As Needed 4)  Nitrostat 0.4 Mg Subl (Nitroglycerin) .... As Needed 5)  Klor-Con M20 20 Meq Cr-Tabs (Potassium  Chloride Crys Cr) .Marland Kitchen.. 1 By Mouth Daily 6)  Effient 10 Mg Tabs (Prasugrel Hcl) .Marland Kitchen.. 1 By Mouth Daily 7)  Cozaar 50 Mg Tabs (Losartan Potassium) .Marland Kitchen.. 1 By Mouth Dialy 8)  Aspirin 81 Mg  Tabs (Aspirin) .Marland Kitchen.. 1 By Mouth Daily 9)  Omeprazole 20 Mg Cpdr (Omeprazole) .Marland Kitchen.. 1 By Mouth Daily 10)  Pravastatin Sodium 40 Mg Tabs (Pravastatin Sodium) .Marland Kitchen.. 1 By Mouth Daily  Allergies (verified): No Known Drug Allergies  Past History:  Past Medical History: CAD, NATIVE VESSEL (ICD-414.01)   a. s/p AL STEMI 07/20/10 . . . c/b VF/PEA; VDRF; ARF. . . tx with BMS to LAD   b. cath 2/17: CFX with diff plaque; p-mRCA with serial 30% Echo 07/25/10: EF 55% (poor endo. visualization); ?apical AK; post Eff Glucose Intol   a. A1C 6 (07/2010) Anemia HYPERTENSION, HX OF (ICD-V12.50) HYPERLIPIDEMIA (ICD-272.4) BENIGN PROSTATIC HYPERTROPHY, WITH URINARY OBSTRUCTION (ICD-600.01)  Social History: Reviewed history from 08/13/2010 and no changes required.  The patient works part-time at a funeral home.  He quit   smoking at the time of his first MI.  He drinks alcohol on rare   occasion.  [place] Vet with [employer]     Vital Signs:  Patient profile:   65 year old male Height:      66 inches Weight:      188 pounds BMI:     30.45 Pulse rate:  60 / minute Pulse rhythm:   irregular Resp:     16 per minute BP sitting:   125 / 76  (left arm) Cuff size:   large  Vitals Entered By: Marrion Coy, CNA (August 14, 2010 8:56 AM)  Physical Exam  General:  Well nourished, well developed, in no acute distress HEENT: normal Neck: no JVD Cardiac:  normal S1, S2; RRR; no murmur Lungs:  clear to auscultation bilaterally, no wheezing, rhonchi or rales Abd: soft, nontender, no hepatomegaly Ext: no  edema; RFA and LFA sites without hematoma or bruit Endo: No thyromegaly Skin: warm and dry Neuro:  CNs 2-12 intact, no focal abnormalities noted    EKG  Procedure date:  08/14/2010  Findings:      Normal Sinus  Rhythm Heart rate 60 Normal axis Low-voltage Poor R-wave progression T-wave inversions in leads 1, aVL, V1-V5; increased artifact in lead V6  Impression & Recommendations:  Problem # 1:  ACUT MI ANTEROLAT WALL EPIS CARE UNS (ICD-410.00) He is doing well post MI.  He had a very complicated course and was critically ill at one point.  He appears to be recovering nicely.  He is still working with PT at home.  When he is discharged, he has been instructed to call us so we can get him into cardiac rehab.  He lives closer to [facility] or [place]. He has some arm soreness, but this sounds MSK.  He will remain on Effient and ASA.    Problem # 2:  CAD, NATIVE VESSEL (ICD-414.01) As above.  I will give him a prescription for his Effient.  He will followup with Dr. Excell Seltzer in one month.  Problem # 3:  ANEMIA, SECONDARY TO BLOOD LOSS (ICD-280.0) Repeat CBC today.  His B12 level was low in the hospital.  However, his MCV was normal.  He's never had a history of B12 deficiency.  Also another B12 level with his CBC today.  If this is low, he'll need followup with his PCP.  Continued iron for now.  Orders: TLB-CBC Platelet - w/Differential (85025-CBCD) TLB-B12 + Folate Pnl ([id number]-U98/JXB)  Problem # 4:  ELEVATED LFTS (ICD-790.4) These were probably elevated in the hospital secondary to his acute MI.  He'll have followup LFTs today.  Problem # 5:  HYPOKALEMIA (ICD-276.8) He is on potassium replacement now.  Check a basic metabolic panel today.  Orders: TLB-BMP (Basic Metabolic Panel-BMET) (80048-METABOL) TLB-CBC Platelet - w/Differential (85025-CBCD) TLB-Hepatic/Liver Function Pnl (80076-HEPATIC) TLB-B12 + Folate Pnl ([id number]/MVH)  Problem # 6:  HYPERLIPIDEMIA (ICD-272.4) This is managed by his PCP at the [facility].  His updated medication list for this problem includes:    Pravastatin Sodium 40 Mg Tabs (Pravastatin sodium) .Marland Kitchen... 1 by mouth daily  Problem # 7:  HYPERTENSION  (ICD-401.9) Controlled.  Problem # 8:  GLUCOSE INTOLERANCE (ICD-271.3) Hemoglobin A1c was 6 in the hospital.  He should follow up with his PCP for management.  Other Orders: EKG w/ Interpretation (93000)  Patient Instructions: 1)  Your physician recommends that you schedule a follow-up appointment in: WITH DR. Excell Seltzer AS PER Sheriann Newmann, PA-C. 2)  Your physician recommends that you return for lab work in: TODAY BMET 410.70, 786.8, CBC, 280.0, 276.8, LFT 410.70, 276.8, B-12 280.0, 276.8. 3)  A WRITTEN PRESCRIPTION FOR EFFIENT HAS BEEN GIVEN TO YOU TODAY AS PER YOUR REQUEST. 4)  Your physician recommends that you continue on your current medications as directed. Please refer to the Current Medication list given to you  today. Prescriptions: EFFIENT 10 MG TABS (PRASUGREL HCL) 1 by mouth daily  #30 x 11   Entered by:   Danielle Rankin, CMA   Authorized by:   Tereso Newcomer PA-C   Signed by:   Danielle Rankin, CMA on 08/14/2010   Method used:   Print then Give to Patient   RxID:   [id number]   Appended Document: post hospital The patient began to feel poorly while his blood was being drawn.  He was brought back around to my area.  I was down in the treadmill room at the time.  The patient collapsed.  A code Erskine Emery was called.  Dr. Clifton James and Dr. Excell Seltzer both attended to him.  His pulse was in the 40s with sinus brady when he was hooked up to the monitor.  He was probably asystolic for about 10 seconds before he was put on the monitor.  His BP was 100/80.  We suspect he had a severe vagal episode with his blood draw.  He did lose control of his bladder.  Otherwise, we do not think he had any seizure-like activity.  He will be admitted for observation.  Cycle cardiac enzymes.  Check a head CT.

## 2010-08-21 NOTE — Discharge Summary (Signed)
NAMEKENNIEL, BERGSMA                 ACCOUNT NO.:  [id number]  MEDICAL RECORD NO.:  [id number]           PATIENT TYPE:  I  LOCATION:  2922                         FACILITY:  [facility]  PHYSICIAN:  Veverly Fells. Excell Seltzer, MD  DATE OF BIRTH:  [birth year]  DATE OF ADMISSION:  08/14/2010 DATE OF DISCHARGE:  08/15/2010                              DISCHARGE SUMMARY   PRIMARY CARDIOLOGIST:  Veverly Fells. Excell Seltzer, MD  PRIMARY CARE PROVIDER:  [facility].  DISCHARGE DIAGNOSIS:  Syncope, etiology most consistent with vasovagal response secondary to blood draw.  SECONDARY DIAGNOSES: 1. Ventricular fibrillation/pulseless electrical activity arrest,     requiring prolonged cardiopulmonary resuscitation on July 20, 2010.     a.     Acute anterolateral ST-segment elevation myocardial      infarction with cardiac catheterization on July 20, 2010:      Occluded proximal left anterior descending successfully treated      with salvage thrombectomy, percutaneous transluminal coronary      angioplasty, and placement of a 3.0 x 15-mm Vision bare-metal      stent. 2. History of nonoliguric acute renal failure in the setting of     cardiogenic shock during admission on July 20, 2010. 3. Hypertension. 4. Hyperlipidemia. 5. Benign prostatic hypertrophy, with urinary obstruction. 6. Glucose tolerance, hemoglobin A1c of 6 in February 2012.  ALLERGIES:  No known drug allergies.  PROCEDURES/DIAGNOSTICS PERFORMED DURING HOSPITALIZATION:  None.  REASON FOR ADMISSION:  This is a 65 year old gentleman with a recent discharge from [facility] on July 28, 2010, with an anterolateral ST- segment elevation myocardial infarction, ventricular fibrillation arrest/PEA requiring prolonged CPR, cardiogenic shock, VDRF, and acute renal failure who presented to the [facility] outpatient office for post hospitalization follow up.  The patient states he had been doing well since this time.  He denies  any chest discomfort or shortness of breath, although he still has chest soreness secondary to prolonged CPR on February 17.  The patient was doing well and was to have a repeat CBC on the day of the office visit and was taken to the lab in the office for blood draw.  At this time, the tech had difficulty obtaining blood and per the patient was "fishing around."  The patient began to feel flushed and nauseated and then proceeded to have a syncopal event.  The patient denies any chest pain.  The patient denies any postictal symptoms.  With the patient recent critically ill course, it was felt the patient should be admitted overnight for observation.  HOSPITAL COURSE:  The patient was brought to the Step-Down Unit at [facility]. He has been monitored on telemetry through 24 hours now.  His telemetry did show sinus rhythm with an occasional PVC.  No further arrhythmias. The patient ruled out for myocardial infarction, again the patient denied any chest pain.  A CT of the head demonstrated no acute findings. EKG was without change.  The patient was able to ambulate without difficulty.  After observation for 24 hours and review of lab results, it was felt the patient's syncopal episode was  most consistent with a vasovagal response secondary to difficult blood draw.  At this time, there is no increase concern for arrhythmias.  Therefore, no outpatient monitoring is felt necessary.    On day of discharge, Dr. Excell Seltzer evaluated the patient and noted him stable for home with outpatient follow up. The patient has been hemodynamically stable.  DISCHARGE LABORATORY DATA:  Cardiac enzymes negative x3.  WBC 11.5, hemoglobin 12.9, hematocrit 39.4, and platelet 315.  Sodium 138, potassium 4.8, BUN 13, and creatinine 0.94.  DISCHARGE MEDICATIONS:  "Unchanged from admission meds," 1. Aspirin enteric-coated 81 mg daily. 2. Coreg 6.25 mg 1 tablet twice daily with meals. 3. Ferrous sulfate 325 mg 1 tablet  daily. 4. Vicodin 5/325 mg 1 tablet every 6 hours as needed for pain. 5. Losartan 50 mg 1 tablet daily. 6. Nitroglycerin sublingual 0.4 mg 1 tablet under tongue q.5 minutes     as needed for chest pain. 7. Omeprazole 20 mg daily. 8. Potassium chloride 20 mEq daily. 9. Pravastatin 40 mg daily. 10.Prasugrel 10 mg 1 tablet daily.  FOLLOWUP PLANS AND INSTRUCTIONS: 1. The patient will follow up with Dr. Excell Seltzer on April 12 at 10:30     a.m. 2. The patient is to increase activity slowly.  He is to stop any     activity that cause chest pain or shortness breath. 3. The patient is to continue a low-sodium, heart-healthy diet. 4. The patient is to call office in the interim if there are any     questions or concerns.   DURATION OF DISCHARGE: Greater than 30 minutes with physician and  physician extender time.   Leonette Monarch, PA-C   ______________________________ Veverly Fells. Excell Seltzer, MD    NB/MEDQ  D:  08/15/2010  T:  08/16/2010  Job:  [id number]  cc:   [facility]  Electronically Signed by Alen Blew P.A. on 08/20/2010 11:01:33 AM Electronically Signed by Tonny Bollman MD on 08/21/2010 06:00:59 PM

## 2010-08-30 NOTE — Letter (Signed)
[facility]   Imported By: Marylou Mccoy 08/20/2010 14:36:11  _____________________________________________________________________  External Attachment:    Type:   Image     Comment:   External Document

## 2010-09-07 ENCOUNTER — Encounter: Payer: Self-pay | Admitting: Cardiovascular Disease

## 2010-09-13 ENCOUNTER — Ambulatory Visit (INDEPENDENT_AMBULATORY_CARE_PROVIDER_SITE_OTHER): Payer: Medicare Other | Admitting: Cardiovascular Disease

## 2010-09-13 ENCOUNTER — Encounter: Payer: Self-pay | Admitting: Cardiovascular Disease

## 2010-09-13 DIAGNOSIS — E785 Hyperlipidemia, unspecified: Secondary | ICD-10-CM

## 2010-09-13 DIAGNOSIS — I10 Essential (primary) hypertension: Secondary | ICD-10-CM

## 2010-09-13 DIAGNOSIS — I4901 Ventricular fibrillation: Secondary | ICD-10-CM

## 2010-09-13 DIAGNOSIS — I2510 Atherosclerotic heart disease of native coronary artery without angina pectoris: Secondary | ICD-10-CM

## 2010-09-13 NOTE — Patient Instructions (Signed)
Your physician recommends that you schedule a follow-up appointment in: 6 WEEKS  Your physician has requested that you have an echocardiogram in 6 WEEKS. Echocardiography is a painless test that uses sound waves to create images of your heart. It provides your doctor with information about the size and shape of your heart and how well your heart's chambers and valves are working. This procedure takes approximately one hour. There are no restrictions for this procedure.  Your physician recommends that you continue on your current medications as directed. Please refer to the Current Medication list given to you today.

## 2010-09-23 ENCOUNTER — Encounter: Payer: Self-pay | Admitting: Cardiovascular Disease

## 2010-09-23 NOTE — Progress Notes (Signed)
HPI:  [name] is a 65 yo male with a history of CAD, status post MI in 2005 treated medically. He presented to [facility] on 07/20/10 with an AL STEMI complicated by VFib arrest/PEA requiring prolonged CPR, cardiogenic shock, ventilator dependent respiratory failure, nonoliguric acute renal failure. He was treated with IABP/inotropes. After prolonged attempts at reviving him, he did develop a stable rhythm and was taken to the Cath Lab for salvage PCI. He had a totally occluded LAD that was treated with PTCA/thrombectomy and bare-metal stent. EF was noted to be preserved on followup echocardiogram. He was seen in follow-up in February and had a syncopal event that occurred with phlebotomy and he required a 23 hour observation admission.  The patient is doing better. He reports fatigue, but denies chest pain or pressure. He denies dyspnea, orthopnea, or PND. No palpitations, lightheadedness, or recurrent syncope.    Outpatient Encounter Prescriptions as of 09/13/2010  Medication Sig Dispense Refill  . aspirin 81 MG tablet Take 81 mg by mouth daily.        . carvedilol (COREG) 6.25 MG tablet Take 6.25 mg by mouth 2 (two) times daily with a meal.        . ferrous sulfate 325 (65 FE) MG EC tablet Take 325 mg by mouth daily with breakfast.        . losartan (COZAAR) 50 MG tablet Take 50 mg by mouth daily.        . nitroGLYCERIN (NITROSTAT) 0.4 MG SL tablet Place 0.4 mg under the tongue every 5 (five) minutes as needed.        Marland Kitchen omeprazole (PRILOSEC) 20 MG capsule Take 20 mg by mouth daily.        . potassium chloride SA (K-DUR,KLOR-CON) 20 MEQ tablet Take 20 mEq by mouth daily.        . prasugrel (EFFIENT) 10 MG TABS Take 10 mg by mouth daily.        . pravastatin (PRAVACHOL) 40 MG tablet Take 40 mg by mouth daily.        Marland Kitchen DISCONTD: HYDROcodone-acetaminophen (NORCO) 5-325 MG per tablet Take 1 tablet by mouth every 6 (six) hours as needed.          No Known Allergies  Past Medical History    Diagnosis Date  . CAD, NATIVE VESSEL 08/13/2010  . GLUCOSE INTOLERANCE 08/14/2010  . ANEMIA, SECONDARY TO BLOOD LOSS 08/14/2010  . HYPERLIPIDEMIA 08/13/2010  . HYPERTENSION 08/14/2010  . BENIGN PROSTATIC HYPERTROPHY, WITH URINARY OBSTRUCTION 08/13/2010    ROS: Negative except as per HPI  BP 138/76  Pulse 54  Resp 18  Ht 5\' 6"  (1.676 m)  Wt 183 lb 12 oz (83.348 kg)  BMI 29.66 kg/m2  PHYSICAL EXAM: Pt is alert and oriented, overweight male in NAD HEENT: normal Neck: JVP - normal, carotids 2+= without bruits Lungs: CTA bilaterally CV: RRR without murmur or gallop Abd: soft, NT, Positive BS, no hepatomegaly Ext: no C/C/E, distal pulses intact and equal Skin: warm/dry no rash  EKG: Sinus brady 54 bpm, low voltage QRS, T wave abnoramlity consider anterior ischemia.  ASSESSMENT AND PLAN:

## 2010-09-23 NOTE — Assessment & Plan Note (Signed)
The patient is slowly improving after his complicated MI. He is on a stable medical regimen. We discussed strategies at increasing exercise with hopes of improving overall energy level. Recommend repeat a 2D echo when he follows up in 6 weeks. His prior echo study had poor endocardial visualization and I think accurate assessment of his LVEF is important (it was estimated at 55% by that study).

## 2010-09-23 NOTE — Assessment & Plan Note (Signed)
LDL at goal of less than 70 mg/dL on pravastatin.

## 2010-09-23 NOTE — Assessment & Plan Note (Signed)
BP well-controlled on combination of carvedilol and losartan.

## 2010-10-17 ENCOUNTER — Encounter: Payer: Self-pay | Admitting: Cardiovascular Disease

## 2010-10-17 ENCOUNTER — Ambulatory Visit (HOSPITAL_COMMUNITY): Payer: Medicare Other | Attending: Cardiovascular Disease | Admitting: Radiology

## 2010-10-17 ENCOUNTER — Ambulatory Visit (INDEPENDENT_AMBULATORY_CARE_PROVIDER_SITE_OTHER): Payer: Medicare Other | Admitting: Cardiovascular Disease

## 2010-10-17 DIAGNOSIS — I252 Old myocardial infarction: Secondary | ICD-10-CM | POA: Insufficient documentation

## 2010-10-17 DIAGNOSIS — I2510 Atherosclerotic heart disease of native coronary artery without angina pectoris: Secondary | ICD-10-CM

## 2010-10-17 DIAGNOSIS — E785 Hyperlipidemia, unspecified: Secondary | ICD-10-CM

## 2010-10-17 DIAGNOSIS — I059 Rheumatic mitral valve disease, unspecified: Secondary | ICD-10-CM | POA: Insufficient documentation

## 2010-10-17 DIAGNOSIS — I10 Essential (primary) hypertension: Secondary | ICD-10-CM | POA: Insufficient documentation

## 2010-10-17 DIAGNOSIS — I4901 Ventricular fibrillation: Secondary | ICD-10-CM

## 2010-10-17 DIAGNOSIS — I079 Rheumatic tricuspid valve disease, unspecified: Secondary | ICD-10-CM | POA: Insufficient documentation

## 2010-10-17 NOTE — Progress Notes (Signed)
HPI:  [name] is a 65 yo male with a history of CAD, status post MI in 2005 treated medically. He presented to [facility] on 07/20/10 with an AL STEMI complicated by VFib arrest/PEA requiring prolonged CPR, cardiogenic shock, ventilator dependent respiratory failure, nonoliguric acute renal failure. He was treated with primary PCI, IABP, and inotropes. He had a totally occluded LAD that was treated with PTCA/thrombectomy and bare-metal stent.   The patient presents today for followup evaluation. He has been doing well with no chest pain or dyspnea. He has been slowly increasing his activity level. He denies lightheadedness, palpitations, or syncope. He had a followup 2-D echocardiogram today. He reports compliance with his medications.  Outpatient Encounter Prescriptions as of 10/17/2010  Medication Sig Dispense Refill  . aspirin 81 MG tablet Take 81 mg by mouth daily.        . carvedilol (COREG) 6.25 MG tablet Take 6.25 mg by mouth 2 (two) times daily with a meal.        . ferrous sulfate 325 (65 FE) MG EC tablet Take 325 mg by mouth daily with breakfast.        . losartan (COZAAR) 50 MG tablet Take 50 mg by mouth daily.        . nitroGLYCERIN (NITROSTAT) 0.4 MG SL tablet Place 0.4 mg under the tongue every 5 (five) minutes as needed.        . potassium chloride SA (K-DUR,KLOR-CON) 20 MEQ tablet Take 20 mEq by mouth daily.        . prasugrel (EFFIENT) 10 MG TABS Take 10 mg by mouth daily.        . pravastatin (PRAVACHOL) 40 MG tablet Take 40 mg by mouth daily.        Marland Kitchen omeprazole (PRILOSEC) 20 MG capsule Take 20 mg by mouth daily.          No Known Allergies  Past Medical History  Diagnosis Date  . CAD, NATIVE VESSEL 08/13/2010  . GLUCOSE INTOLERANCE 08/14/2010  . ANEMIA, SECONDARY TO BLOOD LOSS 08/14/2010  . HYPERLIPIDEMIA 08/13/2010  . HYPERTENSION 08/14/2010  . BENIGN PROSTATIC HYPERTROPHY, WITH URINARY OBSTRUCTION 08/13/2010    ROS: Negative except as per HPI  BP 122/68   Pulse 52  Resp 18  Ht 5\' 6"  (1.676 m)  Wt 184 lb (83.462 kg)  BMI 29.70 kg/m2  PHYSICAL EXAM: Pt is alert and oriented, NAD HEENT: normal Neck: JVP - normal, carotids 2+= without bruits Lungs: CTA bilaterally CV: RRR without murmur or gallop Abd: soft, NT, Positive BS, no hepatomegaly Ext: no C/C/E, distal pulses intact and equal Skin: warm/dry no rash  ASSESSMENT AND PLAN:

## 2010-10-17 NOTE — Patient Instructions (Signed)
Your physician recommends that you continue on your current medications as directed. Please refer to the Current Medication list given to you today.  Your physician recommends that you schedule a follow-up appointment in: 3 MONTHS  

## 2010-10-30 ENCOUNTER — Encounter: Payer: Self-pay | Admitting: Cardiovascular Disease

## 2010-10-30 NOTE — Assessment & Plan Note (Signed)
Lipids at goal on pravastatin. The patient's LDL was less than 70 at last check. Recent lipid panel was reviewed.

## 2010-10-30 NOTE — Assessment & Plan Note (Signed)
The patient is stable without angina. His echocardiogram was reviewed and shows normalization of LV function which really is remarkable considering his presentation.  Will continue his current medical program which includes aspirin, effient, losartan, and carvedilol.

## 2010-10-30 NOTE — Assessment & Plan Note (Signed)
Blood pressure is well controlled on his current medical program. 

## 2010-11-26 ENCOUNTER — Telehealth: Payer: Self-pay | Admitting: Cardiovascular Disease

## 2010-11-26 NOTE — Telephone Encounter (Signed)
All Cardiac faxed to [facility] @ [phone] 11/26/10/km

## 2011-01-14 ENCOUNTER — Telehealth: Payer: Self-pay | Admitting: Cardiovascular Disease

## 2011-01-14 NOTE — Telephone Encounter (Signed)
Per pt call pt was having chest pain and sob over the weekend. Pt said he took one nitro, with relief. Pt said BP and HR are back to normal. Transferred call to [place].

## 2011-01-14 NOTE — Telephone Encounter (Signed)
Patient called to let Dr. Excell Seltzer know that this past Saturday  about 9:00 PM he had chest pain and pressure score of "8" and SOB. B/P 137/87 patient took one NTG SL with relief. He states that day he had worked all day out side in hot temperature, does not know if that caused the chest pain. Today he is feeling fine no C/O B/P 130/93 to 125/84 pulse 66 beats/minute. Patient has an appointment with Dr. Excell Seltzer on August 28 th 2012. He would like to know if MD would like to see him sooner.

## 2011-01-15 ENCOUNTER — Inpatient Hospital Stay (HOSPITAL_COMMUNITY)
Admission: EM | Admit: 2011-01-15 | Discharge: 2011-01-17 | DRG: 247 | Disposition: A | Discharge status: Home / Self Care | Payer: Medicare Other | Source: Ambulatory Visit | Attending: Cardiovascular Disease | Admitting: Cardiovascular Disease

## 2011-01-15 ENCOUNTER — Emergency Department (HOSPITAL_COMMUNITY): Payer: Medicare Other

## 2011-01-15 DIAGNOSIS — I214 Non-ST elevation (NSTEMI) myocardial infarction: Principal | ICD-10-CM | POA: Diagnosis present

## 2011-01-15 DIAGNOSIS — I2510 Atherosclerotic heart disease of native coronary artery without angina pectoris: Secondary | ICD-10-CM | POA: Diagnosis present

## 2011-01-15 DIAGNOSIS — E739 Lactose intolerance, unspecified: Secondary | ICD-10-CM | POA: Diagnosis present

## 2011-01-15 DIAGNOSIS — Z7902 Long term (current) use of antithrombotics/antiplatelets: Secondary | ICD-10-CM

## 2011-01-15 DIAGNOSIS — Z79899 Other long term (current) drug therapy: Secondary | ICD-10-CM

## 2011-01-15 DIAGNOSIS — Z823 Family history of stroke: Secondary | ICD-10-CM

## 2011-01-15 DIAGNOSIS — R079 Chest pain, unspecified: Secondary | ICD-10-CM

## 2011-01-15 DIAGNOSIS — I10 Essential (primary) hypertension: Secondary | ICD-10-CM | POA: Diagnosis present

## 2011-01-15 DIAGNOSIS — Z7982 Long term (current) use of aspirin: Secondary | ICD-10-CM

## 2011-01-15 DIAGNOSIS — Z8249 Family history of ischemic heart disease and other diseases of the circulatory system: Secondary | ICD-10-CM

## 2011-01-15 DIAGNOSIS — Z9861 Coronary angioplasty status: Secondary | ICD-10-CM

## 2011-01-15 DIAGNOSIS — E785 Hyperlipidemia, unspecified: Secondary | ICD-10-CM | POA: Diagnosis present

## 2011-01-15 DIAGNOSIS — I252 Old myocardial infarction: Secondary | ICD-10-CM

## 2011-01-15 DIAGNOSIS — N400 Enlarged prostate without lower urinary tract symptoms: Secondary | ICD-10-CM | POA: Diagnosis present

## 2011-01-15 DIAGNOSIS — I959 Hypotension, unspecified: Secondary | ICD-10-CM | POA: Diagnosis not present

## 2011-01-15 DIAGNOSIS — Z87891 Personal history of nicotine dependence: Secondary | ICD-10-CM

## 2011-01-15 LAB — COMPREHENSIVE METABOLIC PANEL
ALT: 34 U/L (ref 0–53)
AST: 30 U/L (ref 0–37)
Alkaline Phosphatase: 65 U/L (ref 39–117)
CO2: 26 mEq/L (ref 19–32)
Chloride: 106 mEq/L (ref 96–112)
GFR calc non Af Amer: >60 mL/min (ref >60)
Sodium: 139 mEq/L (ref 135–145)
Total Bilirubin: 0.5 mg/dL (ref 0.3–1.2)

## 2011-01-15 LAB — CBC
MCV: 84.8 fL (ref 78.0–100.0)
Platelets: 267 10*3/uL (ref 150–400)
RBC: 5.21 MIL/uL (ref 4.22–5.81)
WBC: 8.6 10*3/uL (ref 4.0–10.5)

## 2011-01-15 LAB — APTT: aPTT: 28 seconds (ref 24–37)

## 2011-01-15 LAB — POCT I-STAT TROPONIN I

## 2011-01-15 LAB — DIFFERENTIAL
Basophils Absolute: 0 10*3/uL (ref 0.0–0.1)
Basophils Relative: 0 % (ref 0–1)
Eosinophils Absolute: 0.1 10*3/uL (ref 0.0–0.7)
Lymphs Abs: 2.2 10*3/uL (ref 0.7–4.0)
Neutrophils Relative %: 65 % (ref 43–77)

## 2011-01-15 LAB — PROTIME-INR: INR: 1.09 (ref 0.00–1.49)

## 2011-01-15 NOTE — Telephone Encounter (Signed)
I spoke with the patient on the telephone. He has had 2 episodes of chest pain now, both responsive to nitroglycerin. He has not had any chest pain today. Considering his extensive cardiac history, I advised him to go to the emergency department for evaluation and admission to the hospital. He will probably need a cardiac catheterization for definitive evaluation.

## 2011-01-15 NOTE — Telephone Encounter (Signed)
Pt calling back to speak to nurse. Please return pt call.

## 2011-01-15 NOTE — Telephone Encounter (Signed)
Spoke with pt who reports he had another episode of chest pain last night. Relieved with 1 NTG. No pain today.  I told pt I would review with Dr. Excell Seltzer and call him back today.

## 2011-01-16 DIAGNOSIS — I2510 Atherosclerotic heart disease of native coronary artery without angina pectoris: Secondary | ICD-10-CM

## 2011-01-16 LAB — BASIC METABOLIC PANEL
BUN: 17 mg/dL (ref 6–23)
Chloride: 108 mEq/L (ref 96–112)
Creatinine, Ser: 0.78 mg/dL (ref 0.50–1.35)
GFR calc Af Amer: >60 mL/min (ref >60)

## 2011-01-16 LAB — CBC
MCV: 85.1 fL (ref 78.0–100.0)
Platelets: 235 10*3/uL (ref 150–400)
RDW: 14.5 % (ref 11.5–15.5)
WBC: 7.5 10*3/uL (ref 4.0–10.5)

## 2011-01-16 LAB — CARDIAC PANEL(CRET KIN+CKTOT+MB+TROPI)
CK, MB: 4.4 ng/mL — ABNORMAL HIGH (ref 0.3–4.0)
Relative Index: INVALID (ref 0.0–2.5)
Troponin I: 1.77 ng/mL (ref <0.30)

## 2011-01-16 LAB — HEPARIN LEVEL (UNFRACTIONATED): Heparin Unfractionated: 0.42 IU/mL (ref 0.30–0.70)

## 2011-01-16 LAB — POCT ACTIVATED CLOTTING TIME: Activated Clotting Time: 474 seconds

## 2011-01-17 DIAGNOSIS — I214 Non-ST elevation (NSTEMI) myocardial infarction: Secondary | ICD-10-CM

## 2011-01-17 LAB — BASIC METABOLIC PANEL
CO2: 25 mEq/L (ref 19–32)
Chloride: 108 mEq/L (ref 96–112)
Glucose, Bld: 97 mg/dL (ref 70–99)
Potassium: 3.7 mEq/L (ref 3.5–5.1)
Sodium: 140 mEq/L (ref 135–145)

## 2011-01-17 LAB — CBC
Hemoglobin: 13.1 g/dL (ref 13.0–17.0)
RBC: 4.49 MIL/uL (ref 4.22–5.81)
WBC: 6.7 10*3/uL (ref 4.0–10.5)

## 2011-01-17 LAB — DIFFERENTIAL
Basophils Absolute: 0 10*3/uL (ref 0.0–0.1)
Basophils Relative: 0 % (ref 0–1)
Monocytes Relative: 9 % (ref 3–12)
Neutro Abs: 3.9 10*3/uL (ref 1.7–7.7)
Neutrophils Relative %: 58 % (ref 43–77)

## 2011-01-17 LAB — CARDIAC PANEL(CRET KIN+CKTOT+MB+TROPI)
CK, MB: 3.9 ng/mL (ref 0.3–4.0)
Total CK: 61 U/L (ref 7–232)
Troponin I: 1.74 ng/mL (ref <0.30)

## 2011-01-25 NOTE — Discharge Summary (Addendum)
NAMEJAMESYN, LINDELL NO.:  [id number]  MEDICAL RECORD NO.:  [id number]  LOCATION:  2916                         FACILITY:  [facility]  PHYSICIAN:  Veverly Fells. Excell Seltzer, MD  DATE OF BIRTH:  April 11, 1946  DATE OF ADMISSION:  01/15/2011 DATE OF DISCHARGE:  01/17/2011                              DISCHARGE SUMMARY   DISCHARGE DIAGNOSES: 1. Non-ST segment elevation myocardial infarction. 2. Coronary artery disease.     a.     Status post PTCA and drug-eluting stent placement to the      proximal left anterior descending, January 16, 2011, felt to be      culprit vessel for non-ST segment elevation myocardial infarction.     b.     Acute anterolateral ST-segment elevation myocardial      infarction with cardiac catheterization, July 20, 2010,      demonstrating occluded proximal left anterior descending,      successfully treated with salvage thrombectomy, PTCA, and bare-      metal stent placement, complicated by ventricular fibrillation/PEA      arrest, requiring prolonged CPR. 3. Mild left ventricular dysfunction with overall preserved left     ventricular ejection fraction of 50% by cath January 16, 2011. 4. Hypertension, with hypotension this admission, losartan stopped. 5. Hyperlipidemia. 6. Benign prostatic hyperplasia with history of urinary obstruction. 7. History of nonoliguric acute renal failure in the setting of     cardiogenic shock, February 2012, with normalization of his renal     function since then. 8. History of glucose intolerance.  HOSPITAL COURSE:  Mr. Huskins is a 65 year old gentleman with a history of acute anterolateral MI in February of this year, complicated by V-Fib, cardiogenic shock, and subsequent renal failure and respiratory failure. He did quite well and was discharged approximately 10 days later. Repeat ultrasound, May 2012, demonstrated normal LV function.  He has been doing well until this admission.  He developed chest  pain associated with some shortness of breath, persisting for several hours. Nitroglycerin helped relieve the pain, but he became concerned, so came to the ER.  There, cardiac markers were found to be positive with a troponin peak of 1.77.  Otherwise, lab work was fairly unremarkable.  He was referred for cardiac catheterization, January 16, 2011, demonstrating severe proximal LAD stenosis with successful PCI using a single drug- eluting stent.  Otherwise, he had nonobstructive circumflex and RCA stenosis.  The patient was observed overnight and did well.  Today is free of complaints of chest pain or shortness of breath.  Dr. Excell Seltzer seen and examined him today and feels he is stable for discharge.  DISCHARGE LABS:  WBC 6.7, hemoglobin 13.1, hematocrit 38.6, platelet count 227.  Sodium 140, potassium 3.7, chloride 108, CO2 25, glucose 97, BUN 13, creatinine 0.73.  LFTs were normal on August 14.  TSH 1.506, peak troponin was 1.77.  STUDIES: 1. Chest x-ray, January 15, 2011, showed small calcified granuloma     within the left upper lobe.  No evidence for active chest disease. 2. Cardiac catheterization, January 16, 2011, please see full report     for details as well as HPI for  summary.  DISCHARGE MEDICATIONS: 1. Aspirin 81 mg daily. 2. Coreg 6.25 mg b.i.d. 3. Nitroglycerin sublingual 0.4 mg every 5 minutes as needed up to     three doses for chest pain. 4. Omeprazole 20 mg daily at bedtime. 5. Potassium chloride 20 mEq daily. 6. Pravastatin 40 mg at bedtime. 7. Effient 10 mg daily.  Losartan was stopped because of hypotension this admission.  DISPOSITION:  Mr. Hiney will be discharged in stable condition to home. He is not to lift anything over 5 pounds for 1 week, drive for 2 days, or participate in sexual activity for 1 week.  He is to follow a low- sodium heart-healthy diet, and call or return if he notices any pain, swelling, bleeding or pus at his cath site.  He will follow up  with Dr. Excell Seltzer as scheduled January 21, 2011 at 9 a.m. and may return to work on January 23, 2011.  DURATION OF DISCHARGE ENCOUNTER:  Greater than 30 minutes including physician and PA time.     Ronie Spies, P.A.C.   ______________________________ Veverly Fells. Excell Seltzer, MD    DD/MEDQ  D:  01/17/2011  T:  01/17/2011  Job:  [id number]  cc:   Veverly Fells. Excell Seltzer, MD  Electronically Signed by Ronie Spies  on 01/25/2011 07:52:08 AM Electronically Signed by Tonny Bollman MD on 02/06/2011 11:33:47 PM

## 2011-01-28 NOTE — H&P (Signed)
NAMEJESPER, Parker NO.:  [id number]  MEDICAL RECORD NO.:  [id number]  LOCATION:  2916                         FACILITY:  [facility]  PHYSICIAN:  Duke Salvia, MD, FACCDATE OF BIRTH:  12-22-1945  DATE OF ADMISSION:  01/15/2011 DATE OF DISCHARGE:                             HISTORY & PHYSICAL   [name] is seen in the emergency room at the request of the emergency room physicians because of chest pain.  This gentleman is a 65 year old man who had an acute anterolateral infarction in February of this year secondary to occlusion of his left anterior descending with subsequent salvage PTCA and stenting.  There was accompanied thrombectomy.  He needed intraaortic balloons pump support as his course was further complicated by ventricular fibrillation, cardiogenic shock, and subsequent renal failure and respiratory failure.  He did surprisingly well and was discharged about 10 days later and a repeat ultrasound in May 2012 demonstrated normal left ventricular function.  He has had no significant symptoms of chest pain or shortness of breath.  On Saturday night, he developed chest pain that was associated with some shortness of breath.  It persisted for a couple of hours.  He took some nitroglycerin overtime and it resolved.  Last night, he had recurrent chest pain and took nitroglycerin again. It lasted about an hour.  It was accompanied again by some shortness of breath.  He called the office today and was instructed to come to the emergency room.  His cholesterol levels have been at goal.  He does not smoke.  He has been compliant with his medications.  There has been no intercurrent URI.  He does a history of hypercholesterolemia, hypertension, and prostatic hypertrophy.  SOCIAL HISTORY:  He is here with his girlfriend.  He works part time at a funeral home.  He does not smoke.  He uses alcohol rarely.  FAMILY HISTORY:  Notable for coronary artery  disease and prior stroke.  MEDICATIONS: 1. Coreg 6.25 b.i.d. 2. Iron. 3. Effient 10. 4. Cozaar 50. 5. Aspirin 81. 6. Omeprazole 20. 7. Pravastatin 40.  He has no known drug allergies.  REVIEW OF SYSTEMS:  Broadly negative apart from what is outlined previously.  PHYSICAL EXAMINATION:  GENERAL:  He is an older Caucasian gentleman, appearing his stated age of 64.  He is in no acute distress and he denied chest pain. VITAL SIGNS:  His blood pressure was 96/57.  His pulse was 62.  His respirations were 16 and unlabored. HEENT:  Normal. NECK:  His carotids are brisk and full bilaterally without bruits.  JVP was flat. BACK:  Without kyphosis or scoliosis. LUNGS:  Clear. HEART:  Heart sounds were regular.  There was an S4.  There was no significant murmurs. ABDOMEN:  Soft with active bowel sounds.  There was no hepatomegaly. Femoral pulses were 2+.  Distal pulses were intact.  There was no clubbing, cyanosis, or edema. NEUROLOGICAL:  Grossly normal. SKIN:  Warm and dry.  Electrocardiogram dated today demonstrated sinus rhythm at 62 with intervals of 0.14/0.08/0.38.  There are two done, one at 1500 hours and one at 1600 hours, they were both similar.  They were  compared with electrocardiogram from May, which demonstrated persistent T-wave inversions in the anterior precordium.  He also has P-pulmonale.  Other laboratories today were notable for troponin on arrival of 1.75 with his last troponin in March being 0.03.  Troponin #2 was 1.55. Metabolic profile was normal including creatinine of 0.99 with creatinine clearance of greater than 60.  IMPRESSION: 1. Non-ST-segment elevation myocardial infarction. 2. Prior anterolateral myocardial infarction with an left anterior     descending occlusion complicated by cardiac arrest, cardiogenic     shock, renal failure, and respiratory failure, but now with an     ejection fraction that was normal. 3. Hypertension. 4.  Anemia.  DISCUSSION:  [name] has acute non-ST-segment elevation MI. Undoubtedly, his stent is patent given his electrocardiogram.  He had nonobstructive residual disease, so I am not quite sure where the source of his ischemia is.  We will treat him with heparin tonight in addition to his Effient.  He should have near maximal platelet inhibition.  We will continue his low-dose beta-blockers up-titration which is precluded by his relative bradycardia.  We will anticipate catheterization in the morning.     Duke Salvia, MD, [facility]     SCK/MEDQ  D:  01/15/2011  T:  01/16/2011  Job:  [id number]  Electronically Signed by Sherryl Manges MD [facility] on 01/28/2011 01:53:50 PM

## 2011-01-29 ENCOUNTER — Encounter: Payer: Self-pay | Admitting: Cardiovascular Disease

## 2011-01-29 ENCOUNTER — Ambulatory Visit (INDEPENDENT_AMBULATORY_CARE_PROVIDER_SITE_OTHER): Payer: Medicare Other | Admitting: Cardiovascular Disease

## 2011-01-29 DIAGNOSIS — I2510 Atherosclerotic heart disease of native coronary artery without angina pectoris: Secondary | ICD-10-CM

## 2011-01-29 DIAGNOSIS — E785 Hyperlipidemia, unspecified: Secondary | ICD-10-CM

## 2011-01-29 DIAGNOSIS — I10 Essential (primary) hypertension: Secondary | ICD-10-CM

## 2011-01-29 MED ORDER — ZOLPIDEM TARTRATE 5 MG PO TABS: 5.0000 mg | ORAL_TABLET | Freq: Every evening | ORAL | Status: DC | PRN

## 2011-01-29 NOTE — Patient Instructions (Signed)
Your physician recommends that you schedule a follow-up appointment in: 3 months with Dr. Cooper.  

## 2011-02-06 NOTE — Cardiovascular Report (Signed)
NAMEBROLY, HATFIELD NO.:  [id number]  MEDICAL RECORD NO.:  [id number]  LOCATION:  2916                         FACILITY:  [facility]  PHYSICIAN:  Veverly Fells. Excell Seltzer, MD  DATE OF BIRTH:  1945/08/27  DATE OF PROCEDURE:  01/16/2011 DATE OF DISCHARGE:                           CARDIAC CATHETERIZATION   PROCEDURES: 1. Left heart catheterization. 2. Selective coronary angiography. 3. Left ventricular angiography. 4. Percutaneous transluminal coronary angioplasty and stenting of the     left anterior descending. 5. Perclose to the right femoral artery.  SURGEON:  Veverly Fells. Excell Seltzer, MD  Hilarie Fredrickson INDICATIONS:  [name] is a 65 year old gentleman with coronary disease.  He presented with an anterior infarct and V-fib arrest several months ago.  He was treated with bare metal stenting of the proximal LAD during his salvage PCI procedure at the time of his cardiac arrest.  The patient had initially done well but has developed chest pain over the last week.  He came to the hospital and ruled in for myocardial infarction.  He had recurrent pain this morning and is brought in for urgent cardiac catheterization.  He has been on nitroglycerin, heparin, aspirin, and Effient.  Risks and indications of the procedure were reviewed with the patient and informed consent was obtained.  The right groin was prepped, draped, and anesthetized with 1% lidocaine.  Using modified Seldinger technique, a 5-French sheath was placed in the right femoral artery.  Standard Judkins catheters were used for coronary angiography and left ventriculography.  At the completion of the diagnostic procedure, I elected to proceed with PCI.  DIAGNOSTIC FINDINGS:  Aortic pressure 99/57 with a mean of 73, left ventricular pressure 110/16.  Coronary angiography:  The left main is patent.  It divides into the LAD and left circumflex.  There is no obstructive disease.  LAD:  The LAD is a large-caliber  vessel.  There is a critical ulcerated plaque in the proximal LAD at the proximal edge of the previously implanted stent.  There is an associated 95-99% stenosis with TIMI 2 flow in the apical portion of the LAD.  The first and second septal perforating branches have 80% ostial stenoses.  The first diagonal branch has an 80% ostial stenosis and it is moderate in caliber.  The second diagonal branch is patent.  The LAD does reach the left ventricular apex.  Left circumflex:  The left circumflex is patent.  It supplies a proximal first OM without significant stenosis.  There is an atrial branch arising from the mid circumflex.  At the origin of the first OM branch, there is a 50% stenosis present.  Right coronary artery:  The RCA is patent.  There is 20-30% diffuse proximal irregularity as well as mid irregularity.  The origin of the acute marginal branch has a 50% stenosis.  The PDA and posterolateral branches are patent without significant stenosis.  Left ventriculography:  There is apical hypokinesis.  The basal and mid anterior walls are vigorous.  The inferior wall is vigorous.  The ejection fraction is estimated at 50%.  PERCUTANEOUS CORONARY INTERVENTION NOTE:  The 5-French sheath in the right femoral artery was upsized to a 6-French.  Angiomax was used for anticoagulation.  An XB LAD 3.5 cm guide catheter was inserted.  A Cougar guidewire was advanced into the mid-LAD.  After a therapeutic ACT was achieved, the proximal LAD lesion was predilated with a 2.5 x 15 mm Sprinter Legend balloon to 8 atmospheres on 2 inflations.  There was severe proximal stenosis, in-stent restenosis, and moderate stenosis off the distal edge of the stent.  I decided to treat the entire area with a 3.0 x 28 mm Promus element stent.  The stent was carefully positioned and deployed at 12 atmospheres, so that there was full lesion coverage. The stent was postdilated with a 3.25 x 20 mm [employer] TREK balloon  which was taken to 14 atmospheres distally and 18 atmospheres proximally.  The proximal stent was again postdilated with a 3.5 x 12 mm  TREK balloon to 16 atmospheres as it appeared to be a large vessel in the proximal aspect.  There was an excellent result with 0% residual stenosis and TIMI 3 flow in the vessel.  FINAL CONCLUSIONS: 1. Severe proximal left anterior descending stenosis with successful     percutaneous intervention using a single drug-eluting stent. 2. Nonobstructive left circumflex and right coronary artery stenoses. 3. Mild left ventricular dysfunction with overall preserved left     ventricular ejection fraction.  RECOMMENDATIONS:  The patient should continue with aggressive medical therapy and dual antiplatelet therapy with aspirin and Effient for a minimum of 12 months.     Veverly Fells. Excell Seltzer, MD     MDC/MEDQ  D:  01/16/2011  T:  01/17/2011  Job:  [id number]  Electronically Signed by Tonny Bollman MD on 02/06/2011 11:33:53 PM

## 2011-02-12 ENCOUNTER — Telehealth: Payer: Self-pay | Admitting: Cardiovascular Disease

## 2011-02-12 NOTE — Telephone Encounter (Signed)
Pt calling needing surgical clearance to get elbow surgery on September 25 th. Please contact Dr. Teressa Senter to give surgical clearance [phone])

## 2011-02-12 NOTE — Telephone Encounter (Signed)
Spoke with pt and let him know Dr. Excell Seltzer would be in office later this week and could complete necessary paperwork at that time for surgery and I would fax it to Dr. Stark Jock office when complete.  Dr. Stark Jock fax number is [phone].

## 2011-02-15 ENCOUNTER — Encounter: Payer: Self-pay | Admitting: Cardiovascular Disease

## 2011-02-15 NOTE — Telephone Encounter (Signed)
Office note from last visit with Dr. Excell Seltzer faxed to Dr. Stark Jock office.

## 2011-02-15 NOTE — Assessment & Plan Note (Signed)
Well-controlled on current medications. He brings in home blood pressure readings which all show excellent control with heart rates in the 50s and 60s and blood pressure ranging from 112-126/70-84.

## 2011-02-15 NOTE — Progress Notes (Signed)
HPI:  Mr. Caselli returns for followup evaluation. He is a 65 year old gentleman status post anterior infarct and out of hospital arrest he was treated with a bare-metal stent in the proximal LAD in February of this year. He presented earlier this month with non-ST elevation infarction after experiencing recurrent chest pain and pressure. Cardiac catheterization demonstrated severe proximal LAD stenosis on the proximal edge of his previously placed stent. He was treated with a drug-eluting stent platform with an excellent result. He presents for followup evaluation today.  The patient has had no recurrent chest pain or pressure. He does have episodic shortness of breath which is unchanged. He continues to sleep poorly and he feels fatigued. He denies leg edema, orthopnea, palpitations, or PND. His main complaint is related to pain and numbness in his right hand. 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  Outpatient Encounter Prescriptions as of 01/29/2011  Medication Sig Dispense Refill  . aspirin 81 MG tablet Take 81 mg by mouth daily.        . carvedilol (COREG) 6.25 MG tablet Take 6.25 mg by mouth 2 (two) times daily with a meal.        . nitroGLYCERIN (NITROSTAT) 0.4 MG SL tablet Place 0.4 mg under the tongue every 5 (five) minutes as needed.        Marland Kitchen omeprazole (PRILOSEC) 20 MG capsule Take 20 mg by mouth daily.        . potassium chloride SA (K-DUR,KLOR-CON) 20 MEQ tablet Take 20 mEq by mouth daily.        . prasugrel (EFFIENT) 10 MG TABS Take 10 mg by mouth daily.        . pravastatin (PRAVACHOL) 40 MG tablet Take 40 mg by mouth daily.        Marland Kitchen zolpidem (AMBIEN) 5 MG tablet Take 1 tablet (5 mg total) by mouth at bedtime as needed for sleep.  30 tablet  1  . DISCONTD: losartan (COZAAR) 50 MG tablet Take 50 mg by mouth daily.          No Known Allergies  Past Medical History  Diagnosis Date  . CAD, NATIVE  VESSEL 08/13/2010  . GLUCOSE INTOLERANCE 08/14/2010  . ANEMIA, SECONDARY TO BLOOD LOSS 08/14/2010  . HYPERLIPIDEMIA 08/13/2010  . HYPERTENSION 08/14/2010  . BENIGN PROSTATIC HYPERTROPHY, WITH URINARY OBSTRUCTION 08/13/2010    ROS: Negative except as per HPI  BP 130/80  Pulse 54  Resp 14  Ht 5\' 6"  (1.676 m)  Wt 177 lb (80.287 kg)  BMI 28.57 kg/m2  PHYSICAL EXAM: Pt is alert and oriented, NAD HEENT: normal Neck: JVP - normal, carotids 2+= without bruits Lungs: CTA bilaterally CV: RRR without murmur or gallop Abd: soft, NT, Positive BS, no hepatomegaly Ext: no C/C/E, distal pulses intact and equal Skin: warm/dry no rash  ASSESSMENT AND PLAN:

## 2011-02-15 NOTE — Assessment & Plan Note (Signed)
The patient appears stable today. He's had no recurrent angina. He should remain on dual antiplatelet therapy with aspirin and effient for a minimum of 12 months. I discussed his case with his hand surgeon, Dr. Teressa Senter.  He is comfortable performing his surgery on aspirin and effient. While his risk of cardiac morbidity or perioperative event is increased in the setting of his coronary artery disease and recent non-ST elevation infarction, I think it is reasonable to proceed with surgery when he has 4-6 weeks out from his event. He is optimized from a medical standpoint. He should receive his beta blocker without interruption throughout the perioperative period as well. If he has any problems after surgery please feel free to contact me at any time. The patient does not have active angina or congestive heart failure symptoms so his overall risk should be relatively low.

## 2011-02-15 NOTE — Assessment & Plan Note (Signed)
Lipids are dull with an LDL less than 70. He is on pravastatin.

## 2011-02-22 ENCOUNTER — Telehealth: Payer: Self-pay | Admitting: Cardiovascular Disease

## 2011-02-22 NOTE — Telephone Encounter (Addendum)
I spoke with the pt and made him aware of Dr Earmon Phoenix documentation in 01/29/11 office note.

## 2011-02-22 NOTE — Telephone Encounter (Signed)
Pt is concerned about surgery on elbow and wants to know if he has been cleared for surgery

## 2011-02-26 ENCOUNTER — Ambulatory Visit (HOSPITAL_BASED_OUTPATIENT_CLINIC_OR_DEPARTMENT_OTHER): Admission: RE | Admit: 2011-02-26 | Payer: Medicare Other | Source: Ambulatory Visit | Admitting: Orthopedic Surgery

## 2011-03-19 ENCOUNTER — Ambulatory Visit (HOSPITAL_BASED_OUTPATIENT_CLINIC_OR_DEPARTMENT_OTHER)
Admission: RE | Admit: 2011-03-19 | Discharge: 2011-03-19 | Disposition: A | Discharge status: Home / Self Care | Payer: Medicare Other | Source: Ambulatory Visit | Attending: Orthopedic Surgery | Admitting: Orthopedic Surgery

## 2011-03-19 DIAGNOSIS — G5620 Lesion of ulnar nerve, unspecified upper limb: Secondary | ICD-10-CM | POA: Insufficient documentation

## 2011-03-19 DIAGNOSIS — Z8674 Personal history of sudden cardiac arrest: Secondary | ICD-10-CM | POA: Insufficient documentation

## 2011-03-19 LAB — POCT I-STAT, CHEM 8
HCT: 48 % (ref 39.0–52.0)
Hemoglobin: 16.3 g/dL (ref 13.0–17.0)
Potassium: 4.2 mEq/L (ref 3.5–5.1)
Sodium: 141 mEq/L (ref 135–145)
TCO2: 22 mmol/L (ref 0–100)

## 2011-03-20 NOTE — Op Note (Signed)
NAMEQUANTRELL, SPLITT                 ACCOUNT NO.:  [id number]  MEDICAL RECORD NO.:  [id number]  LOCATION:                                 FACILITY:  PHYSICIAN:  Katy Fitch. Stellar Gensel, M.D. 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  OPERATION:  Right subcutaneous ulnar nerve transposition.  SURGEON:  Katy Fitch. Bexley Laubach, MD  ASSISTANT:  Marveen Reeks Dasnoit, PA-C  ANESTHESIA:  General by LMA.  SUPERVISED ANESTHESIOLOGIST:  Janetta Hora. Gelene Mink, MD  INDICATIONS:  Desiderio Dolata is a 65 year old right-hand dominant retired gentleman from [place], [place].  In February 2012, he sustained a witnessed cardiac arrest who was resuscitated and had a bare-metal stent placed in February 2012 in the LAD.  He is under the care of Dr. Tonny Bollman of [facility]. In August, he had a subsequent episode of ischemia.  He was referred on January 28, 2011, for evaluation and management of severe right hand numbness and discomfort.  Clinical examination revealed profound ulnar intrinsic atrophy and clinical diagnosis of McGowan 3 ulnar neuropathy was noted.  Detailed electrodiagnostic studies of the right upper extremity were accomplished by Dr. Wadie Lessen on January 28, 2011, revealing  profound slowing of the right ulnar nerve across the right elbow with a conduction velocity of 12.2 m/sec versus 54.5 m/sec on the left.  He had marked decrease in his ulnar nerve motor amplitude and sensory amplitude. Mr. Reihl at that time was noted to be in acute aftercare phase of a second stent placement by Dr. Excell Seltzer in August 2012.  We had a detailed discussion with Dr. Excell Seltzer regarding aftercare following a stent placement and timing of possible surgery to the elbow.  Dr. Excell Seltzer recommended that we wait a minimum of 6 weeks and felt that it would be in Mr. Joshua best interest to perform surgery while on aspirin and Effient.  Mr. Gillespie returned to see Dr. Excell Seltzer this month and after a series of detailed discussions we advised that in Dr. Earmon Phoenix judgment, it would be safe for Mr. Bilton to undergo general anesthesia taking care to avoid episodes of hypotension and maintain aspirin and Effient.  With tourniquet ischemia for surgery and careful bipolar coagulation, I felt comfortable that we could accomplish a subcutaneous ulnar nervetransposition.  Mr. Strollo was scheduled at this time with the approval of Dr. Justin Mend of Anesthesia.  Preoperatively, we advised Mr. Bas as to what to expect from the surgery. With a history of cardiac arrest and prolonged hypertension, it is possible that he has infarcted his ulnar  nerve.  It is also evident on clinical examination he has a subluxing ulnar nerve and his elbow x-rays demonstrate a very shallow cubital groove.  We have advised Mr. Stettner that we proceed with a standard transposition, however, his prognosis is very uncertain based on the fact that we do not know the exact mechanism of the injury of his predicament.  Past experience with other individuals with similar stories has shown pain relief and some improvement in his motor function.  However, at age 65 with his history of cardiac arrest, there is no guarantee that he will  have a complete recovery of his ulnar nerve function.  Other risks including infection, failure to relieve all his pain, and the need for possible revision surgery were discussed in detail with Mr. Sugg.  Questions were invited and answered in detail.  PROCEDURE:  Wilder Kurowski was brought to room #2 of the [facility] and placed in supine position on the operating table. Following the induction of general anesthesia by LMA technique under Dr. Zada Girt direct supervision, the right arm was prepped with Betadine soap  and solution, sterilely draped.  A pneumatic tourniquet was applied to the proximal right brachium.  Two grams of Ancef were administered as an IV prophylactic antibiotic per protocol.  Great care was taken to assure that his mean arterial pressure never dropped below 80 during the anesthesia.  Following routine surgical time-out, the right arm was exsanguinated with Esmarch bandage and arterial tourniquet was inflated to 220 mmHg, later inflated to 250 mmHg due to some elevation of his systolic blood pressure.  Procedure commenced with a curvilinear incision paralleling the path of the ulnar nerve.  Subcutaneous tissues were carefully divided, taking care to identify and gently spare the posterior branch of the medial antebrachial cutaneous nerve.  The ulnar nerve was identified and found to be subluxed out of the groove deep to the arcuate ligament and severely compressed and swollen.  The nerve was decompressed from a level 6 cm above the medial epicondyle and 6 cm distal followed by meticulous resection of the medial brachial intermuscular septum, gentle transposition of the nerve, and creation of subcutaneous pocket on top of flexor pronator muscles.  The fascia of the flexor carpi ulnaris was resected distally so that the nerve would lie directly on muscle fiber at its distal turn beneath the flexor carpi ulnaris.  Great care was taken to assure  that there were no kinks in the nerve or pressure points due to fascia.  The brachial fascia was released at least 10 cm proximally to assure that there were no compression points proximally.  By careful release of the epineurium, the nerve was anteriorly transposed.  A subcutaneous pocket was created by suturing the fascia of the forearm to the epicondyle.  A generous pocket was created.  The tourniquet was released and hemostasis was achieved with bipolar cautery under saline.  Due to Effient, there was continuous ooze along the skin margins, therefore the wound was repaired with subcutaneous 3-0 Vicryl, intradermal 3-0 Prolene, and two blue vessel loop drains were placed.  We held compression on the wound for 15 minutes.  We noted clot on the skin surface.  We subsequently dressed the wound with Adaptic sterile gauze, Tegaderm, and an Ace wrap.  There were no apparent complications.  Mr. Cain tolerated surgery and anesthesia well.  He was transferred to the recovery room with stable vital  signs.  He was  placed in a sling for  postop comfort.  We will see him back for followup in 3 days to remove his drains and check his wound.  He is placed on Percocet 5 mg 1 p.o. q.4-6 hours p.r.n. pain, #30 tablets without refill, also Keflex 500 mg 1 p.o. q.8 hours x4 days as a prophylactic antibiotic.     Katy Fitch Kamori Barbier, M.D.     RVS/MEDQ  D:  03/19/2011  T:  03/19/2011  Job:  [id number]  cc:   Delaney Meigs, M.D. Veverly Fells. Excell Seltzer, MD  Electronically Signed by Josephine Igo M.D. on 03/20/2011 07:56:04 AM

## 2011-05-03 ENCOUNTER — Encounter: Payer: Self-pay | Admitting: Cardiovascular Disease

## 2011-05-03 ENCOUNTER — Ambulatory Visit (INDEPENDENT_AMBULATORY_CARE_PROVIDER_SITE_OTHER): Payer: Medicare Other | Admitting: Cardiovascular Disease

## 2011-05-03 DIAGNOSIS — I2510 Atherosclerotic heart disease of native coronary artery without angina pectoris: Secondary | ICD-10-CM

## 2011-05-03 DIAGNOSIS — E785 Hyperlipidemia, unspecified: Secondary | ICD-10-CM

## 2011-05-03 NOTE — Patient Instructions (Signed)
Your physician recommends that you schedule a follow-up appointment in: March 2013  Your physician recommends that you continue on your current medications as directed. Please refer to the Current Medication list given to you today.

## 2011-05-13 NOTE — Progress Notes (Signed)
HPI:  [name] returns for followup evaluation. He is a 65 year old gentleman status post anterior infarct and out of hospital arrest he was treated with a bare-metal stent in the proximal LAD in February of this year. He presented in August 2012 with non-ST elevation infarction after experiencing recurrent chest pain and pressure. Cardiac catheterization demonstrated severe proximal LAD stenosis on the proximal edge of his previously placed stent. He was treated with a drug-eluting stent platform with an excellent result. He presents for followup evaluation today.  Since I have seen him last, he underwent surgery for treatment of a nerve injury in the right arm. His pain is much better since surgery and he is recovering well. He complains of fatigue, but otherwise has no specific complaints. He denies chest pain or pressure. No dyspnea, orthopnea, or PND. No leg swelling.  Outpatient Encounter Prescriptions as of 05/03/2011  Medication Sig Dispense Refill  . aspirin 81 MG tablet Take 81 mg by mouth daily.        . carvedilol (COREG) 6.25 MG tablet Take 6.25 mg by mouth 2 (two) times daily with a meal.        . nitroGLYCERIN (NITROSTAT) 0.4 MG SL tablet Place 0.4 mg under the tongue every 5 (five) minutes as needed.        Marland Kitchen omeprazole (PRILOSEC) 20 MG capsule Take 20 mg by mouth daily.        . potassium chloride SA (K-DUR,KLOR-CON) 20 MEQ tablet Take 20 mEq by mouth daily.        . prasugrel (EFFIENT) 10 MG TABS Take 10 mg by mouth daily.        . pravastatin (PRAVACHOL) 40 MG tablet Take 40 mg by mouth daily.        Marland Kitchen zolpidem (AMBIEN) 5 MG tablet Take 1 tablet (5 mg total) by mouth at bedtime as needed for sleep.  30 tablet  1    No Known Allergies  Past Medical History  Diagnosis Date  . CAD, NATIVE VESSEL 08/13/2010  . GLUCOSE INTOLERANCE 08/14/2010  . ANEMIA, SECONDARY TO BLOOD LOSS 08/14/2010  . HYPERLIPIDEMIA 08/13/2010  . HYPERTENSION 08/14/2010  . BENIGN PROSTATIC HYPERTROPHY, WITH  URINARY OBSTRUCTION 08/13/2010    ROS: Negative except as per HPI  BP 140/74  Pulse 70  Ht 5\' 6"  (1.676 m)  Wt 82.101 kg (181 lb)  BMI 29.21 kg/m2  PHYSICAL EXAM: Pt is alert and oriented, NAD HEENT: normal Neck: JVP - normal, carotids 2+= without bruits Lungs: CTA bilaterally CV: RRR without murmur or gallop Abd: soft, NT, Positive BS, no hepatomegaly Ext: no C/C/E, distal pulses intact and equal. Right wrist is in a brace Skin: warm/dry no rash  ASSESSMENT AND PLAN:

## 2011-05-13 NOTE — Assessment & Plan Note (Signed)
Lipids have been at goal with LDL less than 70 mg/dL. He should have lipids and lft's prior to his next OV.

## 2011-05-13 NOTE — Assessment & Plan Note (Signed)
The patient is stable without angina. He is now 4 months out after DES implantation in the proximal LAD. He will continue his current medical therapy and follow-up in 4 months. He remains on aspirin and effient.

## 2011-05-31 ENCOUNTER — Encounter: Payer: Self-pay | Admitting: Cardiovascular Disease

## 2011-07-24 ENCOUNTER — Encounter: Payer: Self-pay | Admitting: Cardiovascular Disease

## 2011-07-30 ENCOUNTER — Encounter: Payer: Self-pay | Admitting: Cardiovascular Disease

## 2011-08-21 ENCOUNTER — Ambulatory Visit (INDEPENDENT_AMBULATORY_CARE_PROVIDER_SITE_OTHER): Payer: Medicare Other | Admitting: Cardiovascular Disease

## 2011-08-21 ENCOUNTER — Encounter: Payer: Self-pay | Admitting: Cardiovascular Disease

## 2011-08-21 VITALS — BP 130/82 | HR 54 | Ht 66.0 in | Wt 185.0 lb

## 2011-08-21 DIAGNOSIS — I10 Essential (primary) hypertension: Secondary | ICD-10-CM

## 2011-08-21 DIAGNOSIS — I2510 Atherosclerotic heart disease of native coronary artery without angina pectoris: Secondary | ICD-10-CM

## 2011-08-21 DIAGNOSIS — E785 Hyperlipidemia, unspecified: Secondary | ICD-10-CM

## 2011-08-21 NOTE — Patient Instructions (Signed)
Your physician has recommended you make the following change in your medication: Please start Plavix 75mg  once a day when you discontinue Effient, Please stop Prilosec when you start Plavix, START Protonix 40mg  take one by mouth daily (this has been arranged through the [place])  Your physician has requested that you have an exercise tolerance test in 6 MONTHS with Dr Excell Seltzer. For further information please visit [email or web address] Please also follow instruction sheet, as given.

## 2011-08-29 ENCOUNTER — Other Ambulatory Visit: Payer: Self-pay

## 2011-08-29 ENCOUNTER — Other Ambulatory Visit: Payer: Self-pay | Admitting: Cardiovascular Disease

## 2011-08-29 MED ORDER — CARVEDILOL 6.25 MG PO TABS: 6.2500 mg | ORAL_TABLET | Freq: Two times a day (BID) | ORAL | Status: DC

## 2011-09-05 ENCOUNTER — Encounter: Payer: Self-pay | Admitting: Cardiovascular Disease

## 2011-09-05 NOTE — Assessment & Plan Note (Signed)
Blood pressure is well controlled on current medical program. 

## 2011-09-05 NOTE — Progress Notes (Signed)
   HPI:  66 year old gentleman presenting for followup evaluation. The patient is status post anterior wall MI in 2012 treated with a bare-metal stent to the LAD. 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 He was treated with a drug-eluting stent. He has done well since that time. The patient had I nerve injury in the right arm and underwent surgery. He still has some numbness but the pain in the elbow has resolved.  The patient denies chest pain or pressure. He denies edema, orthopnea, or PND. He said gross hematuria and has undergone an evaluation by urology without a clear cause of bleeding. He denies any trauma and denies dysuria.  Outpatient Encounter Prescriptions as of 08/21/2011  Medication Sig Dispense Refill  . aspirin 81 MG tablet Take 81 mg by mouth daily.        . finasteride (PROSCAR) 5 MG tablet Take 5 mg by mouth daily.      . Multiple Vitamin (MULTIVITAMIN) capsule Take 1 capsule by mouth daily.      . nitroGLYCERIN (NITROSTAT) 0.4 MG SL tablet Place 0.4 mg under the tongue every 5 (five) minutes as needed.        Marland Kitchen omeprazole (PRILOSEC) 20 MG capsule Take 20 mg by mouth daily.        . prasugrel (EFFIENT) 10 MG TABS Take 10 mg by mouth daily.        . pravastatin (PRAVACHOL) 40 MG tablet Take 40 mg by mouth daily.        Marland Kitchen DISCONTD: carvedilol (COREG) 6.25 MG tablet Take 6.25 mg by mouth 2 (two) times daily with a meal.        . DISCONTD: potassium chloride SA (K-DUR,KLOR-CON) 20 MEQ tablet Take 20 mEq by mouth daily.        Marland Kitchen DISCONTD: zolpidem (AMBIEN) 5 MG tablet Take 1 tablet (5 mg total) by mouth at bedtime as needed for sleep.  30 tablet  1    No Known Allergies  Past Medical History  Diagnosis Date  . CAD, NATIVE VESSEL 08/13/2010  . GLUCOSE INTOLERANCE 08/14/2010  . ANEMIA, SECONDARY TO BLOOD LOSS 08/14/2010  . HYPERLIPIDEMIA 08/13/2010  . HYPERTENSION 08/14/2010  . BENIGN PROSTATIC HYPERTROPHY, WITH  URINARY OBSTRUCTION 08/13/2010    ROS: Negative except as per HPI  BP 130/82  Pulse 54  Ht 5\' 6"  (1.676 m)  Wt 83.915 kg (185 lb)  BMI 29.86 kg/m2  PHYSICAL EXAM: Pt is alert and oriented, NAD HEENT: normal Neck: JVP - normal, carotids 2+= without bruits Lungs: CTA bilaterally CV: RRR without murmur or gallop Abd: soft, NT, Positive BS, no hepatomegaly Ext: no C/C/E, distal pulses intact and equal Skin: warm/dry no rash  EKG:  Sinus bradycardia 54 beats per minute, septal infarct age undetermined, low voltage QRS.  ASSESSMENT AND PLAN:

## 2011-09-05 NOTE — Assessment & Plan Note (Signed)
The patient is on pravastatin. His goal LDL is less than 70. He is at goal with most recent LDL value of 54.

## 2011-09-05 NOTE — Assessment & Plan Note (Signed)
The patient is stable without angina. Suspicious that effient is contributing to his hematuria. He will require dual antiplatelet therapy for several more months, but I think it would be safe to switch him to Plavix which would have a lower bleeding risk. I asked him to discontinue effient and start Plavix the following day. He will stop omeprazole and start taking Protonix for gastroesophageal reflux disease to avoid the interaction with Plavix. We'll plan on an exercise treadmill test in 6 months.

## 2011-11-13 ENCOUNTER — Telehealth: Payer: Self-pay | Admitting: Cardiovascular Disease

## 2011-11-13 NOTE — Telephone Encounter (Signed)
Per Pam at [facility] - they are looking to schedule the pt for 6/24 th if OK for him to come off plavix/asa

## 2011-11-13 NOTE — Telephone Encounter (Signed)
Pt is having lithotripsy for kidney stones and needs to be off plavix for 7days and asa for 5 days

## 2011-11-17 ENCOUNTER — Emergency Department (HOSPITAL_COMMUNITY): Payer: Medicare Other

## 2011-11-17 ENCOUNTER — Emergency Department (HOSPITAL_COMMUNITY)
Admission: EM | Admit: 2011-11-17 | Discharge: 2011-11-17 | Disposition: A | Discharge status: Home / Self Care | Payer: Medicare Other | Attending: Emergency Medicine | Admitting: Emergency Medicine

## 2011-11-17 ENCOUNTER — Encounter (HOSPITAL_COMMUNITY): Payer: Self-pay | Admitting: Emergency Medicine

## 2011-11-17 DIAGNOSIS — I252 Old myocardial infarction: Secondary | ICD-10-CM | POA: Insufficient documentation

## 2011-11-17 DIAGNOSIS — Z87891 Personal history of nicotine dependence: Secondary | ICD-10-CM | POA: Insufficient documentation

## 2011-11-17 DIAGNOSIS — I10 Essential (primary) hypertension: Secondary | ICD-10-CM | POA: Insufficient documentation

## 2011-11-17 DIAGNOSIS — N400 Enlarged prostate without lower urinary tract symptoms: Secondary | ICD-10-CM | POA: Insufficient documentation

## 2011-11-17 DIAGNOSIS — I2510 Atherosclerotic heart disease of native coronary artery without angina pectoris: Secondary | ICD-10-CM | POA: Insufficient documentation

## 2011-11-17 DIAGNOSIS — Z9861 Coronary angioplasty status: Secondary | ICD-10-CM | POA: Insufficient documentation

## 2011-11-17 DIAGNOSIS — D649 Anemia, unspecified: Secondary | ICD-10-CM | POA: Insufficient documentation

## 2011-11-17 DIAGNOSIS — N200 Calculus of kidney: Secondary | ICD-10-CM

## 2011-11-17 DIAGNOSIS — E785 Hyperlipidemia, unspecified: Secondary | ICD-10-CM | POA: Insufficient documentation

## 2011-11-17 HISTORY — DX: Acute myocardial infarction, unspecified: I21.9

## 2011-11-17 HISTORY — DX: Calculus of kidney: N20.0

## 2011-11-17 LAB — URINE MICROSCOPIC-ADD ON

## 2011-11-17 LAB — URINALYSIS, ROUTINE W REFLEX MICROSCOPIC
Glucose, UA: NEGATIVE mg/dL
Leukocytes, UA: NEGATIVE
Nitrite: NEGATIVE
Protein, ur: 30 mg/dL — AB
pH: 5 (ref 5.0–8.0)

## 2011-11-17 LAB — BASIC METABOLIC PANEL
CO2: 25 mEq/L (ref 19–32)
Chloride: 104 mEq/L (ref 96–112)
Sodium: 139 mEq/L (ref 135–145)

## 2011-11-17 MED ORDER — TAMSULOSIN HCL 0.4 MG PO CAPS: 0.4000 mg | ORAL_CAPSULE | Freq: Every day | ORAL | Status: DC

## 2011-11-17 MED ORDER — SODIUM CHLORIDE 0.9 % IV BOLUS (SEPSIS)
1000.0000 mL | Freq: Once | INTRAVENOUS | Status: AC
  Administered 2011-11-17: 1000 mL via INTRAVENOUS

## 2011-11-17 MED ORDER — ONDANSETRON HCL 4 MG/2ML IJ SOLN
4.0000 mg | Freq: Once | INTRAMUSCULAR | Status: AC
  Administered 2011-11-17: 4 mg via INTRAVENOUS
  Filled 2011-11-17: qty 2

## 2011-11-17 MED ORDER — MORPHINE SULFATE 4 MG/ML IJ SOLN
4.0000 mg | Freq: Once | INTRAMUSCULAR | Status: AC
  Administered 2011-11-17: 4 mg via INTRAVENOUS
  Filled 2011-11-17: qty 1

## 2011-11-17 MED ORDER — OXYCODONE-ACETAMINOPHEN 5-325 MG PO TABS: 2.0000 | ORAL_TABLET | Freq: Four times a day (QID) | ORAL | Status: DC | PRN

## 2011-11-17 MED ORDER — OXYCODONE-ACETAMINOPHEN 5-325 MG PO TABS: 2.0000 | ORAL_TABLET | Freq: Four times a day (QID) | ORAL | Status: AC | PRN

## 2011-11-17 NOTE — Discharge Instructions (Signed)
Kidney Stones Kidney stones (ureteral lithiasis) are solid masses that form inside your kidneys. The intense pain is caused by the stone moving through the kidney, ureter, bladder, and urethra (urinary tract). When the stone moves, the ureter starts to spasm around the stone. The stone is usually passed in the urine.  HOME CARE  Drink enough fluids to keep your pee (urine) clear or pale yellow. This helps to get the stone out.   Strain all pee through the provided strainer. Do not pee without peeing through the strainer, not even once. If you pee the stone out, catch it. The stone may be as small as a grain of salt. Take this to your doctor.   Only take medicine as told by your doctor.   Follow up with your doctor as told.   Get follow-up X-rays as told by your doctor.  GET HELP RIGHT AWAY IF:   Your pain does not get better with medicine.   You have a fever.   Your pain increases and gets worse over 18 hours.   You have new belly (abdominal) pain.   You feel faint or pass out.  MAKE SURE YOU:   Understand these instructions.   Will watch your condition.   Will get help right away if you are not doing well or get worse.  Document Released: 11/06/2007 Document Revised: 05/09/2011 Document Reviewed: 03/17/2009 [employer] Patient Information 2012 [employer]. 

## 2011-11-17 NOTE — ED Provider Notes (Signed)
History     CSN: [id number]  Arrival date & time 11/17/11  1606   First MD Initiated Contact with Patient 11/17/11 1617      Chief Complaint  Patient presents with  . Nephrolithiasis    (Consider location/radiation/quality/duration/timing/severity/associated sxs/prior treatment) HPI  [phone] with h/o nephrolithiasis pw flank pain. She states that he has been seeing a line serology for same. He states that he last saw them 4 days ago. He states that they plan for lithotripsy after he is cleared by cardiology to discontinue his Plavix preprocedural. He states that his pain is intermittent on the right flank radiating to his right groin. He states it is sharp and at its maximum 7/10. He states it is identical to his pain from his kidney stones. He has noticed hematuria as well as hesitancy to urinate. He was only able to urinate small amounts in the emergency department. He denies urinary frequency, urgency, dysuria. He denies fevers, chills. He has mild nausea when the pain is severe. Denies vomiting.  ED Notes, ED Provider Notes from 11/17/11 0000 to 11/17/11 16:15:00       Thomasene Ripple, RN 11/17/2011 16:13      R flank pain since Friday. Reports known kidney stone that he has been seeing [facility] for.      Past Medical History  Diagnosis Date  . CAD, NATIVE VESSEL 08/13/2010  . GLUCOSE INTOLERANCE 08/14/2010  . ANEMIA, SECONDARY TO BLOOD LOSS 08/14/2010  . HYPERLIPIDEMIA 08/13/2010  . HYPERTENSION 08/14/2010  . BENIGN PROSTATIC HYPERTROPHY, WITH URINARY OBSTRUCTION 08/13/2010  . Kidney stone   . Myocardial infarction     Past Surgical History  Procedure Date  . Coronary stent placement     Family History  Problem Relation Age of Onset  . Stroke    . Heart attack      History  Substance Use Topics  . Smoking status: Former Smoker    Quit date: 06/03/2000  . Smokeless tobacco: Not on file  . Alcohol Use: Yes     rarely      Review of Systems  All other  systems reviewed and are negative.   except as noted HPI   Allergies  Review of patient's allergies indicates no known allergies.  Home Medications   Current Outpatient Rx  Name Route Sig Dispense Refill  . ASPIRIN 81 MG PO TABS Oral Take 81 mg by mouth daily.      Marland Kitchen CARVEDILOL 6.25 MG PO TABS Oral Take 1 tablet (6.25 mg total) by mouth 2 (two) times daily with a meal. 60 tablet 6  . CLOPIDOGREL BISULFATE 75 MG PO TABS Oral Take 75 mg by mouth daily.    Marland Kitchen FINASTERIDE 5 MG PO TABS Oral Take 5 mg by mouth daily.    Marland Kitchen NITROGLYCERIN 0.4 MG SL SUBL Sublingual Place 0.4 mg under the tongue every 5 (five) minutes as needed. For chest pain    . PRAVASTATIN SODIUM 40 MG PO TABS Oral Take 40 mg by mouth daily.      Marland Kitchen PRESCRIPTION MEDICATION Oral Take 1 tablet by mouth at bedtime. Patient just started on a new stomach pill. Was taken off of prilosec because it was incompatable with plavix    . OXYCODONE-ACETAMINOPHEN 5-325 MG PO TABS Oral Take 2 tablets by mouth every 6 (six) hours as needed for pain. 30 tablet 0  . TAMSULOSIN HCL 0.4 MG PO CAPS Oral Take 1 capsule (0.4 mg total) by mouth daily. 10 capsule  0    BP 162/86  Pulse 62  Temp 97.6 F (36.4 C) (Oral)  Resp 18  SpO2 96%  Physical Exam  Nursing note and vitals reviewed. Constitutional: He is oriented to person, place, and time. He appears well-developed and well-nourished. No distress.  HENT:  Head: Atraumatic.  Mouth/Throat: Oropharynx is clear and moist.  Eyes: Conjunctivae are normal. Pupils are equal, round, and reactive to light.  Neck: Neck supple.  Cardiovascular: Normal rate, regular rhythm, normal heart sounds and intact distal pulses.  Exam reveals no gallop and no friction rub.   No murmur heard. Pulmonary/Chest: Effort normal. No respiratory distress. He has no wheezes. He has no rales.  Abdominal: Soft. Bowel sounds are normal. There is tenderness. There is no rebound and no guarding.       Mild rlq ttp No groin  ttp  Genitourinary: Penis normal.  Musculoskeletal: Normal range of motion. He exhibits no edema and no tenderness.  Neurological: He is alert and oriented to person, place, and time.  Skin: Skin is warm and dry.  Psychiatric: He has a normal mood and affect.    ED Course  Procedures (including critical care time)  Labs Reviewed  BASIC METABOLIC PANEL - Abnormal; Notable for the following:    Glucose, Bld 121 (*)     GFR calc non Af Amer 75 (*)     GFR calc Af Amer 86 (*)     All other components within normal limits  URINALYSIS, ROUTINE W REFLEX MICROSCOPIC - Abnormal; Notable for the following:    Color, Urine AMBER (*)  BIOCHEMICALS MAY BE AFFECTED BY COLOR   APPearance CLOUDY (*)     Specific Gravity, Urine 1.031 (*)     Hgb urine dipstick LARGE (*)     Bilirubin Urine SMALL (*)     Protein, ur 30 (*)     All other components within normal limits  URINE MICROSCOPIC-ADD ON - Abnormal; Notable for the following:    Casts HYALINE CASTS (*)     Crystals CA OXALATE CRYSTALS (*)     All other components within normal limits   Dg Abd 1 View  11/17/2011  *RADIOLOGY REPORT*  Clinical Data: Right lower quadrant pain.  Back pain.  History of kidney stones.  ABDOMEN - 1 VIEW  Comparison: 11/13/2011.  Findings: Distal migration of 11 mm right renal calculus, likely in the right ureteropelvic junction.  The previously seen in the left renal calculus is partially obscured by overlying bowel gas but appears unchanged.  Calcified phleboliths in the anatomic pelvis appears similar to the prior examination.  IMPRESSION:  1.  Distal migration of 11 mm right renal calculus, likely in the right ureter pelvic junction. 2.  Left inferior pole renal collecting system calculus appears unchanged in location.  Original Report Authenticated By: Andreas Newport, M.D.   Dg Chest Portable 1 View  11/17/2011  *RADIOLOGY REPORT*  Clinical Data: Acute right-sided kidney pain.  Hypertension.  PORTABLE CHEST - 1  VIEW  Comparison: 01/15/2011.  Findings: Heart size upper limits of normal for projection.  Slight apical lordotic projection.   Cardiopericardial silhouette within normal limits. Mediastinal contours normal. Trachea midline.  No airspace disease or effusion. Dense calcified granuloma present in the left upper lobe.  IMPRESSION: No acute abnormality.  Original Report Authenticated By: Andreas Newport, M.D.     1. Nephrolithiasis     MDM  Nephrolithiasis. Pain and nausea controlled in ED. D/W Urology given size and history, plans  for lithotripsy when plavix held even before worsening pain today. May intervene with stent tomorrow. Pt to call office. Given percocet, flomax, urology f/u-- to call for appointment. No EMC precluding discharge at this time. Given Precautions for return. PMD f/u.         Forbes Cellar, MD 11/18/11 [phone]

## 2011-11-17 NOTE — ED Notes (Signed)
Pt states understanding of discharge instructions 

## 2011-11-17 NOTE — ED Notes (Signed)
R flank pain since Friday.  Reports known kidney stone that he has been seeing [facility] for.

## 2011-11-18 NOTE — Telephone Encounter (Signed)
I spoke with Pam and made her aware of the pt's history and that Dr Excell Seltzer will have to make a determination about the pt holding ASA and Plavix.  Per Pam the pt has to hold Plavix for 5 days and ASA for 3 days prior to procedure.  The pt has to hold BOTH Plavix and ASA for lithotripsy.  Pam would like to schedule procedure on 6/24 if approved by Dr Excell Seltzer. I made Pam aware that Dr Excell Seltzer is out of the office this week.  I will call Pam back on Wednesday to let her know if Dr Excell Seltzer has reviewed message.

## 2011-11-18 NOTE — Telephone Encounter (Signed)
Follow up on previous call:  Pam from [facility] calling, check on the status of cardiac clearance.

## 2011-11-19 ENCOUNTER — Other Ambulatory Visit: Payer: Self-pay | Admitting: Urology

## 2011-11-19 ENCOUNTER — Telehealth: Payer: Self-pay | Admitting: Cardiovascular Disease

## 2011-11-19 NOTE — Telephone Encounter (Signed)
I spoke with Pam and now the pt has been scheduled for a stent placement on 11/22/11 to help with pain relief.  The pt can remain on ASA and Plavix for this procedure. Per Pam they are still trying to arrange a lithotripsy for this pt because of the size of kidney stone. Per Pam she can wait for Dr Excell Seltzer to review the pt's chart next week to make further recommendations about Plavix and ASA.

## 2011-11-19 NOTE — Telephone Encounter (Signed)
I spoke with Pam and the pt has been scheduled for stent placement on 11/22/11.  They are still trying to schedule the pt for lithotripsy but are awaiting recommendations from Dr Excell Seltzer about the pt's Plavix and ASA. Please see additional phone note in regards to this pt's kidney stone.

## 2011-11-19 NOTE — Telephone Encounter (Signed)
I spoke with the pt and he would like to know about holding his plavix and ASA prior to procedure.  The pt says that he has been scheduled for a different procedure on 11/22/11. I will have to touch base with Pam to discuss what procedure the pt is scheduled for and what medications need to be held.

## 2011-11-19 NOTE — Telephone Encounter (Signed)
New msg Pt called and he needs a kidney stone crushed. He takes plavix and aspirin. He needs ok to come off before procedure for 7 days. Please call

## 2011-11-20 ENCOUNTER — Encounter (HOSPITAL_COMMUNITY): Payer: Self-pay | Admitting: Pharmacy Technician

## 2011-11-20 NOTE — Telephone Encounter (Signed)
I spoke with Pam and made her aware of Dr Cooper's response.  This note has been faxed to [facility] for her records.

## 2011-11-20 NOTE — Telephone Encounter (Signed)
He is 10 months out from his PCI procedure with a DES in the proximal LAD. Overall his risk is low of holding ASA and plavix for a short period of time as outlined. The time off of both ASA and plavix should be minimized as much as possible.

## 2011-11-20 NOTE — H&P (Signed)
F/u gross hematuria and nephrolithiasis. Pt has noticed red urine starting Nov 2012. He's also had right flank pain. He quit smoking 8 yrs ago, but smoked for 40 yrs. He has no chemo, XRT, or dye/solvent exposure.   He has a history of BPH and is on no treatment. He has been given medicine for it at the [place] several times. He took Flomax in the past. This caused a fast heart rate so he had to stop it. No other GU meds or surgery. He has some frequency. He has no urgency. He voids with a good stream. No intermittent flow or straining. He doesnt feel he needs therapy. No FH prostate cancer. Pt prostate 111 g on CT. He mass dried March 2013.  His frequency improved after an MI 2010-08-27. He coded and was in ICU for 6 days. He needed a heart pump due to CHF. He had another MI Aug 2012. Both times he had PTCA and stents. He also had a small MI in 2005. He is on Effient and NTG.   He has a history of stones. He has had ESWL on the left about 20 yrs ago and was told there was a stone remaining and it would not pass because it was in a "sac". He had a left ureteral stent for this and it "about killed him". He was also told he had a right stone about 8 yrs ago. He's had no recent stone passage, but again had some right flank pain on and off.   CT A/P WITH contrast Jun 06, 2010 - I reviewed all images - 7 mm RLP stone, 10 mm LLP stone, no hydro, "mildly greater enhancement" of anterior collecting system (anterior renal pelvis and UPJ, smooth without filling defect on delayed images) and BPH with a median lobe ("possibility of small tumor").    Jan 2013 labs - Urine cx was negative, but grew 4K CFU. Bun 15, Cr 0.91, LFT's and alk phos normal.  Jan 2013 Cystoscopy was normal apart from BPH and a median lobe. Urine cytology atypical.  Feb 2013 CT A/P repeat - much improvement in "enhancement of right renal pelvis". Ureters normal on delayed. Prostate about 111 g.   Feb 2013 Urine cytology repeat - no malignant  cells  KUB 6/12/103 - right stone has moved into right UPJ. Left stone stable. Normal bowel gas. Normal bones.  Interval Hx Pt went to ER November 17, 2011 with severe right flank pain. KUB showed stone still at right UPJ. In consideration of future procedures, Pam called his cardiologist who is out of town for the week and his nurse thought the patient would not be able to be off both aspirin and Plavix. This would rule out shockwave as a treatment modality option.  His pain is improved today although he is taking pain medicine.   Past Medical History Problems  1. History of  Acute Myocardial Infarction V12.59 2. History of  Congestive Heart Failure 428.0 3. History of  Esophageal Reflux 530.81 4. History of  Gastric Ulcer 531.90 5. History of  Glaucoma 365.9 6. History of  Heart Disease 429.9 7. History of  Hypercholesterolemia 272.0 8. History of  Hypertension 401.9 9. History of  Nephrolithiasis V13.01 10. History of  Renal Failure 586  Surgical History Problems  1. History of  Elbow Surgery Right 2. History of  Inguinal Hernia Repair Right 3. History of  Lithotripsy 4. History of  Reported Hx Of Surgery For An Ulcer  Current  Meds 1. Aspirin 81 MG Oral Tablet; Therapy: (Recorded:11Jan2013) to 2. Carvedilol 6.25 MG Oral Tablet; Therapy: (Recorded:11Jan2013) to 3. Finasteride 5 MG Oral Tablet; Take 1 tablet by mouth every day; Therapy: 04Mar2013 to  (Evaluate:27Feb2014)  Requested for: 04Mar2013; Last Rx:04Mar2013 4. Nitrostat 0.4 MG Sublingual Tablet Sublingual; Therapy: (Recorded:11Jan2013) to 5. Omeprazole 20 MG Oral Tablet Delayed Release; Therapy: (Recorded:11Jan2013) to 6. Plavix TABS; Therapy: (Recorded:12Jun2013) to 7. Pravastatin Sodium 40 MG Oral Tablet; Therapy: (Recorded:11Jan2013) to  Allergies Medication  1. No Known Drug Allergies  Family History Problems  1. Family history of  Death In The Family Father pneumonia 2. Family history of  Death In The Family  Mother CHF 3. Family history of  Family Health Status Number Of Children No children  Social History Problems  1. Former Smoker V15.82 2. Marital History - Divorced V61.03 3. Occupation: Retired 4. History of  Tobacco Use 305.1 smoked 1 pp week x 40 years Denied  5. History of  Alcohol Use 6. History of  Caffeine Use  Review of Systems  Constitutional: no fever.  Cardiovascular: no chest pain.  Respiratory: no shortness of breath.    Vitals Vital Signs [Data Includes: Last 1 Day]  18Jun2013 10:47AM  Blood Pressure: 171 / 91 Temperature: 97.1 F Heart Rate: 50  Physical Exam Constitutional: Well nourished and well developed . No acute distress.  Pulmonary: No respiratory distress and normal respiratory rhythm and effort.  Cardiovascular: Heart rate and rhythm are normal . No peripheral edema.  Neuro/Psych:. Mood and affect are appropriate.    Results/Data Urine [Data Includes: Last 1 Day]   18Jun2013  COLOR YELLOW   APPEARANCE CLEAR   SPECIFIC GRAVITY <1.005   pH 6.0   GLUCOSE NEG mg/dL  BILIRUBIN NEG   KETONE NEG mg/dL  BLOOD LARGE   PROTEIN NEG mg/dL  UROBILINOGEN 0.2 mg/dL  NITRITE NEG   LEUKOCYTE ESTERASE NEG   SQUAMOUS EPITHELIAL/HPF NONE SEEN   WBC NONE SEEN WBC/hpf  RBC 4-6 RBC/hpf  BACTERIA NONE SEEN   CRYSTALS NONE SEEN   CASTS NONE SEEN    Assessment Assessed  1. Nephrolithiasis 592.0   Symptomatic right UPJ stone   Plan Health Maintenance (V70.0)  1. UA With REFLEX  Done: 18Jun2013 10:39AM Nephrolithiasis (592.0)  2. Follow-up Schedule Surgery Office  Follow-up  Done: 18Jun2013  Discussion/Summary  We discussed the nature risk and benefits of continued surveillance versus ureteral stent placement today. Patient does not want to proceed urgently today. Looking forward we discussed again the nature risk and benefits of cystoscopy with stent placement/possible ureteroscopy laser lithotripsy compared to shockwave lithotripsy. Again he is  hesitant to have a stent placed as he had one prior the gave him a lot of discomfort (urgency and frequency, flank pain). All questions answered. After careful consideration on both her parts will proceed with cystoscopy with right ureteral stent placement and ureteroscopy laser lithotripsy if unable to gain proximal access. We discussed this as a large stone in a likely need multiple procedures. We discussed surgical and anesthetic risks risks such as cardiopulmonary problems, death, ureteral injury, bleeding and infection among others. He reports he did have surgery on his arm in the last few months and did fine with anesthesia. I will try to use a Percuflex stent to limit discomfort.  He knows to call should he get any severe uncontrolled pain, nausea vomiting or fevers or chills.  cc: Dr. Copper, Dr. Jackolyn Confer Electronically signed by : Jerilee Field,  M.D.; Nov 19 2011 11:34AM

## 2011-11-21 ENCOUNTER — Encounter (HOSPITAL_COMMUNITY)
Admission: RE | Admit: 2011-11-21 | Discharge: 2011-11-21 | Disposition: A | Discharge status: Home / Self Care | Payer: Medicare Other | Source: Ambulatory Visit | Attending: Urology | Admitting: Urology

## 2011-11-21 ENCOUNTER — Encounter (HOSPITAL_COMMUNITY): Payer: Self-pay

## 2011-11-21 DIAGNOSIS — I219 Acute myocardial infarction, unspecified: Secondary | ICD-10-CM

## 2011-11-21 DIAGNOSIS — Z9289 Personal history of other medical treatment: Secondary | ICD-10-CM

## 2011-11-21 DIAGNOSIS — K219 Gastro-esophageal reflux disease without esophagitis: Secondary | ICD-10-CM

## 2011-11-21 DIAGNOSIS — S129XXA Fracture of neck, unspecified, initial encounter: Secondary | ICD-10-CM

## 2011-11-21 HISTORY — PX: OTHER SURGICAL HISTORY: SHX169

## 2011-11-21 HISTORY — DX: Acute myocardial infarction, unspecified: I21.9

## 2011-11-21 HISTORY — PX: TONSILLECTOMY: SUR1361

## 2011-11-21 HISTORY — DX: Personal history of other medical treatment: Z92.89

## 2011-11-21 HISTORY — DX: Gastro-esophageal reflux disease without esophagitis: K21.9

## 2011-11-21 HISTORY — DX: Fracture of neck, unspecified, initial encounter: S12.9XXA

## 2011-11-21 HISTORY — PX: HERNIA REPAIR: SHX51

## 2011-11-21 HISTORY — PX: ULNAR NERVE TRANSPOSITION: SHX2595

## 2011-11-21 HISTORY — PX: CARDIAC CATHETERIZATION: SHX172

## 2011-11-21 LAB — CBC
Hemoglobin: 15.4 g/dL (ref 13.0–17.0)
MCH: 30 pg (ref 26.0–34.0)
MCHC: 34.1 g/dL (ref 30.0–36.0)
MCV: 87.7 fL (ref 78.0–100.0)

## 2011-11-21 LAB — SURGICAL PCR SCREEN: MRSA, PCR: NEGATIVE

## 2011-11-21 NOTE — Pre-Procedure Instructions (Addendum)
11-21-11 EKG(08-21-11) ,Echo(10-18-10), CXR(11-17-11) reports with chart. Note from Dr. Excell Seltzer 11-20-11 with chart. BMP(11-17-11) with chart to be used. 11-21-11 1035 Call received from [facility]. [place] office), aware Pt. remains on Aspirin and Plavix and will not take Am of only.W. Kennon Portela

## 2011-11-21 NOTE — Patient Instructions (Addendum)
20 CAMDIN HEGNER  11/21/2011   Your procedure is scheduled on: 6-21  -2013  Report to [facility] at       0700 AM.  Call this number if you have problems the morning of surgery: [phone]   Remember:   Do not eat food:After Midnight.    Take these medicines the morning of surgery with A SIP OF WATER: Coreg, Oxycodone Donot take Aspirin or Plavix 11-22-11.   Do not wear jewelry, make-up or nail polish.  Do not wear lotions, powders, or perfumes. You may wear deodorant.  Do not shave 48 hours prior to surgery.(face and neck okay, no shaving of legs)  Do not bring valuables to the hospital.  Contacts, dentures or bridgework may not be worn into surgery.  Leave suitcase in the car. After surgery it may be brought to your room.  For patients admitted to the hospital, checkout time is 11:00 AM the day of discharge.   Patients discharged the day of surgery will not be allowed to drive home.  Name and phone number of your driver: friend  Special Instructions: CHG Shower Use Special Wash: 1/2 bottle night before surgery and 1/2 bottle morning of surgery.(avoid face and genitals)   Please read over the following fact sheets that you were given: MRSA Information.

## 2011-11-22 ENCOUNTER — Encounter (HOSPITAL_COMMUNITY): Payer: Self-pay | Admitting: Anesthesiology

## 2011-11-22 ENCOUNTER — Encounter (HOSPITAL_COMMUNITY): Payer: Self-pay | Admitting: *Deleted

## 2011-11-22 ENCOUNTER — Ambulatory Visit (HOSPITAL_COMMUNITY): Payer: Medicare Other | Admitting: Anesthesiology

## 2011-11-22 ENCOUNTER — Other Ambulatory Visit: Payer: Self-pay | Admitting: Urology

## 2011-11-22 ENCOUNTER — Encounter (HOSPITAL_COMMUNITY)
Admission: RE | Disposition: A | Discharge status: Home / Self Care | Payer: Self-pay | Source: Ambulatory Visit | Attending: Urology

## 2011-11-22 ENCOUNTER — Ambulatory Visit (HOSPITAL_COMMUNITY)
Admission: RE | Admit: 2011-11-22 | Discharge: 2011-11-22 | Disposition: A | Discharge status: Home / Self Care | Payer: Medicare Other | Source: Ambulatory Visit | Attending: Urology | Admitting: Urology

## 2011-11-22 DIAGNOSIS — E7800 Pure hypercholesterolemia, unspecified: Secondary | ICD-10-CM | POA: Insufficient documentation

## 2011-11-22 DIAGNOSIS — I2510 Atherosclerotic heart disease of native coronary artery without angina pectoris: Secondary | ICD-10-CM | POA: Insufficient documentation

## 2011-11-22 DIAGNOSIS — N200 Calculus of kidney: Secondary | ICD-10-CM | POA: Insufficient documentation

## 2011-11-22 DIAGNOSIS — Z79899 Other long term (current) drug therapy: Secondary | ICD-10-CM | POA: Insufficient documentation

## 2011-11-22 DIAGNOSIS — N400 Enlarged prostate without lower urinary tract symptoms: Secondary | ICD-10-CM | POA: Insufficient documentation

## 2011-11-22 DIAGNOSIS — I252 Old myocardial infarction: Secondary | ICD-10-CM | POA: Insufficient documentation

## 2011-11-22 DIAGNOSIS — I10 Essential (primary) hypertension: Secondary | ICD-10-CM | POA: Insufficient documentation

## 2011-11-22 DIAGNOSIS — Z7982 Long term (current) use of aspirin: Secondary | ICD-10-CM | POA: Insufficient documentation

## 2011-11-22 DIAGNOSIS — K219 Gastro-esophageal reflux disease without esophagitis: Secondary | ICD-10-CM | POA: Insufficient documentation

## 2011-11-22 DIAGNOSIS — Z01812 Encounter for preprocedural laboratory examination: Secondary | ICD-10-CM | POA: Insufficient documentation

## 2011-11-22 DIAGNOSIS — Z7902 Long term (current) use of antithrombotics/antiplatelets: Secondary | ICD-10-CM | POA: Insufficient documentation

## 2011-11-22 DIAGNOSIS — I509 Heart failure, unspecified: Secondary | ICD-10-CM | POA: Insufficient documentation

## 2011-11-22 SURGERY — CYSTOSCOPY, WITH RETROGRADE PYELOGRAM AND URETERAL STENT INSERTION
Anesthesia: General | Laterality: Right

## 2011-11-22 MED ORDER — LACTATED RINGERS IV SOLN
INTRAVENOUS | Status: DC | PRN
  Administered 2011-11-22: 09:00:00 via INTRAVENOUS

## 2011-11-22 MED ORDER — IOHEXOL 300 MG/ML  SOLN
INTRAMUSCULAR | Status: AC
  Filled 2011-11-22: qty 1

## 2011-11-22 MED ORDER — CEFAZOLIN SODIUM-DEXTROSE 2-3 GM-% IV SOLR: 2.0000 g | Freq: Once | INTRAVENOUS | Status: DC

## 2011-11-22 MED ORDER — SODIUM CHLORIDE 0.9 % IR SOLN
Status: DC | PRN
  Administered 2011-11-22: 3000 mL

## 2011-11-22 MED ORDER — ASPIRIN EC 81 MG PO TBEC: 81.0000 mg | DELAYED_RELEASE_TABLET | Freq: Every day | ORAL | Status: AC

## 2011-11-22 MED ORDER — CEFAZOLIN SODIUM-DEXTROSE 2-3 GM-% IV SOLR
INTRAVENOUS | Status: AC
  Filled 2011-11-22: qty 50

## 2011-11-22 SURGICAL SUPPLY — 16 items
ADAPTER CATH URET PLST 4-6FR (CATHETERS) IMPLANT
BAG URO CATCHER STRL LF (DRAPE) IMPLANT
CATH INTERMIT  6FR 70CM (CATHETERS) IMPLANT
CATH URET 5FR 28IN OPEN ENDED (CATHETERS) IMPLANT
CLOTH BEACON ORANGE TIMEOUT ST (SAFETY) IMPLANT
DRAPE CAMERA CLOSED 9X96 (DRAPES) IMPLANT
GLOVE BIOGEL M STRL SZ7.5 (GLOVE) IMPLANT
GLOVE SURG SS PI 8.0 STRL IVOR (GLOVE) IMPLANT
GOWN PREVENTION PLUS XLARGE (GOWN DISPOSABLE) IMPLANT
GOWN STRL NON-REIN LRG LVL3 (GOWN DISPOSABLE) IMPLANT
GOWN STRL REIN XL XLG (GOWN DISPOSABLE) IMPLANT
GUIDEWIRE STR DUAL SENSOR (WIRE) IMPLANT
MANIFOLD NEPTUNE II (INSTRUMENTS) IMPLANT
MARKER SKIN DUAL TIP RULER LAB (MISCELLANEOUS) IMPLANT
PACK CYSTO (CUSTOM PROCEDURE TRAY) IMPLANT
TUBING CONNECTING 10 (TUBING) IMPLANT

## 2011-11-22 NOTE — OR Nursing (Signed)
Dr.Eskridge in to see pt.  Decision made to cancel surgery today, with plans to have lithotripsy next week on Thursday.Pt. Transferred back to [facility] to dress for discharge.

## 2011-11-22 NOTE — Progress Notes (Signed)
Patient instructed to stop Plavix today and stop Aspirin on Sunday. Per Pam at Dr. Estil Daft office in preparation for ESWL

## 2011-11-22 NOTE — Anesthesia Preprocedure Evaluation (Addendum)
Anesthesia Evaluation  Patient identified by MRN, date of birth, ID band Patient awake    Reviewed: Allergy & Precautions, H&P , NPO status , Patient's Chart, lab work & pertinent test results  Airway Mallampati: II TM Distance: <3 FB Neck ROM: Full    Dental No notable dental hx.    Pulmonary neg pulmonary ROS,  breath sounds clear to auscultation  Pulmonary exam normal       Cardiovascular hypertension, Pt. on medications + CAD and + Past MI Rhythm:Regular Rate:Normal  Increased risk for periop MI  EF 55%   Neuro/Psych negative neurological ROS  negative psych ROS   GI/Hepatic Neg liver ROS, GERD-  Medicated,  Endo/Other  negative endocrine ROS  Renal/GU negative Renal ROS  negative genitourinary   Musculoskeletal negative musculoskeletal ROS (+)   Abdominal   Peds negative pediatric ROS (+)  Hematology negative hematology ROS (+)   Anesthesia Other Findings   Reproductive/Obstetrics negative OB ROS                          Anesthesia Physical Anesthesia Plan  ASA: III  Anesthesia Plan: General   Post-op Pain Management:    Induction: Intravenous  Airway Management Planned: LMA  Additional Equipment:   Intra-op Plan:   Post-operative Plan:   Informed Consent: I have reviewed the patients History and Physical, chart, labs and discussed the procedure including the risks, benefits and alternatives for the proposed anesthesia with the patient or authorized representative who has indicated his/her understanding and acceptance.   Dental advisory given  Plan Discussed with: CRNA  Anesthesia Plan Comments:         Anesthesia Quick Evaluation

## 2011-11-22 NOTE — Interval H&P Note (Signed)
History and Physical Interval Note:  11/22/2011 9:39 AM  [name]  has presented today for surgery, with the diagnosis of Right Renal Stone  The various methods of treatment have been discussed with the patient and family. Since I saw the patient in the office, I received correspondance from Dr. Excell Seltzer that it would be OK to hold ASA and Plavix for ESWL. I discussed with patient and after consideration of risks, benefits and other options for treatment, the patient has decided to cancel Procedure(s) (LRB): CYSTOSCOPY WITH RETROGRADE PYELOGRAM/URETERAL STENT PLACEMENT (Right) URETERECTOMY HOLMIUM LASER APPLICATION.  He really wants to avoid invasive surgery and a ureteral stent if at all possible. He's had no further pain from the right renal stone. We discussed risk of heart attack and death if he holds the Plavix and ASA. We also discussed again risks of bleeding/hematoma from ESWL. Questions were answered to the patient's satisfaction. He knows to call should he get uncontrolled pain, F/C, N/V.      Antony Haste

## 2011-11-22 NOTE — Progress Notes (Signed)
Returned to short stay from OR holding. Surgery cancelled and pt is to have ESWL next week. ESWL instructions given to patient and blue folder given to pt with instructions.

## 2011-11-22 NOTE — Discharge Instructions (Signed)
Kidney Stones Kidney stones (ureteral lithiasis) are deposits that form inside your kidneys. The intense pain is caused by the stone moving through the urinary tract. When the stone moves, the ureter goes into spasm around the stone. The stone is usually passed in the urine.  CAUSES   A disorder that makes certain neck glands produce too much parathyroid hormone (primary hyperparathyroidism).   A buildup of uric acid crystals.   Narrowing (stricture) of the ureter.   A kidney obstruction present at birth (congenital obstruction).   Previous surgery on the kidney or ureters.   Numerous kidney infections.  SYMPTOMS   Feeling sick to your stomach (nauseous).   Throwing up (vomiting).   Blood in the urine (hematuria).   Pain that usually spreads (radiates) to the groin.   Frequency or urgency of urination.  DIAGNOSIS   Taking a history and physical exam.   Blood or urine tests.   Computerized X-ray scan (CT scan).   Occasionally, an examination of the inside of the urinary bladder (cystoscopy) is performed.  TREATMENT   Observation.   Increasing your fluid intake.   Surgery may be needed if you have severe pain or persistent obstruction.  The size, location, and chemical composition are all important variables that will determine the proper choice of action for you. Talk to your caregiver to better understand your situation so that you will minimize the risk of injury to yourself and your kidney.  HOME CARE INSTRUCTIONS   Drink enough water and fluids to keep your urine clear or pale yellow.   Strain all urine through the provided strainer. Keep all particulate matter and stones for your caregiver to see. The stone causing the pain may be as small as a grain of salt. It is very important to use the strainer each and every time you pass your urine. The collection of your stone will allow your caregiver to analyze it and verify that a stone has actually passed.   Only take  over-the-counter or prescription medicines for pain, discomfort, or fever as directed by your caregiver.   Make a follow-up appointment with your caregiver as directed.   Get follow-up X-rays if required. The absence of pain does not always mean that the stone has passed. It may have only stopped moving. If the urine remains completely obstructed, it can cause loss of kidney function or even complete destruction of the kidney. It is your responsibility to make sure X-rays and follow-ups are completed. Ultrasounds of the kidney can show blockages and the status of the kidney. Ultrasounds are not associated with any radiation and can be performed easily in a matter of minutes.  SEEK IMMEDIATE MEDICAL CARE IF:   Pain cannot be controlled with the prescribed medicine.   You have a fever.   The severity or intensity of pain increases over 18 hours and is not relieved by pain medicine.   You develop a new onset of abdominal pain.   You feel faint or pass out.  MAKE SURE YOU:   Understand these instructions.   Will watch your condition.   Will get help right away if you are not doing well or get worse.   MEDICATION instructions:  Stop taking Plavix.  Stop taking Aspirin on Monday November 25, 2011 (may take in the morning, no aspirin after Noon on Monday).   Do not take ibuprofen, Aleve, Advil, etc.  OK to take medicines containing Tylenol or acetaminophen  Call with any questions [phone]

## 2011-11-22 NOTE — Progress Notes (Signed)
Discharged via ambulatory to home with friend. He has been given ESWL instructions and blue folder

## 2011-11-28 ENCOUNTER — Ambulatory Visit (HOSPITAL_COMMUNITY)
Admission: RE | Admit: 2011-11-28 | Discharge: 2011-11-28 | Disposition: A | Discharge status: Home / Self Care | Payer: Medicare Other | Source: Ambulatory Visit | Attending: Urology | Admitting: Urology

## 2011-11-28 ENCOUNTER — Encounter (HOSPITAL_COMMUNITY): Payer: Self-pay | Admitting: *Deleted

## 2011-11-28 ENCOUNTER — Encounter (HOSPITAL_COMMUNITY)
Admission: RE | Disposition: A | Discharge status: Home / Self Care | Payer: Self-pay | Source: Ambulatory Visit | Attending: Urology

## 2011-11-28 DIAGNOSIS — I252 Old myocardial infarction: Secondary | ICD-10-CM | POA: Insufficient documentation

## 2011-11-28 DIAGNOSIS — Z7902 Long term (current) use of antithrombotics/antiplatelets: Secondary | ICD-10-CM | POA: Insufficient documentation

## 2011-11-28 DIAGNOSIS — Z7982 Long term (current) use of aspirin: Secondary | ICD-10-CM | POA: Insufficient documentation

## 2011-11-28 DIAGNOSIS — N200 Calculus of kidney: Secondary | ICD-10-CM | POA: Insufficient documentation

## 2011-11-28 DIAGNOSIS — E7800 Pure hypercholesterolemia, unspecified: Secondary | ICD-10-CM | POA: Insufficient documentation

## 2011-11-28 DIAGNOSIS — Z79899 Other long term (current) drug therapy: Secondary | ICD-10-CM | POA: Insufficient documentation

## 2011-11-28 DIAGNOSIS — N400 Enlarged prostate without lower urinary tract symptoms: Secondary | ICD-10-CM | POA: Insufficient documentation

## 2011-11-28 DIAGNOSIS — I10 Essential (primary) hypertension: Secondary | ICD-10-CM | POA: Insufficient documentation

## 2011-11-28 DIAGNOSIS — I509 Heart failure, unspecified: Secondary | ICD-10-CM | POA: Insufficient documentation

## 2011-11-28 DIAGNOSIS — K219 Gastro-esophageal reflux disease without esophagitis: Secondary | ICD-10-CM | POA: Insufficient documentation

## 2011-11-28 SURGERY — LITHOTRIPSY, ESWL
Anesthesia: LOCAL | Laterality: Right

## 2011-11-28 MED ORDER — CIPROFLOXACIN HCL 500 MG PO TABS
ORAL_TABLET | ORAL | Status: AC
  Filled 2011-11-28: qty 1

## 2011-11-28 MED ORDER — CIPROFLOXACIN HCL 500 MG PO TABS: 500.0000 mg | ORAL_TABLET | Freq: Two times a day (BID) | ORAL | Status: AC

## 2011-11-28 MED ORDER — DIAZEPAM 5 MG PO TABS
10.0000 mg | ORAL_TABLET | ORAL | Status: AC
  Administered 2011-11-28: 10 mg via ORAL

## 2011-11-28 MED ORDER — OXYCODONE-ACETAMINOPHEN 5-325 MG PO TABS: 2.0000 | ORAL_TABLET | Freq: Four times a day (QID) | ORAL | Status: AC | PRN

## 2011-11-28 MED ORDER — CIPROFLOXACIN HCL 500 MG PO TABS
500.0000 mg | ORAL_TABLET | ORAL | Status: AC
  Administered 2011-11-28: 500 mg via ORAL

## 2011-11-28 MED ORDER — ASPIRIN 81 MG PO TABS: 81.0000 mg | ORAL_TABLET | Freq: Every day | ORAL | Status: DC

## 2011-11-28 MED ORDER — DIPHENHYDRAMINE HCL 25 MG PO CAPS
ORAL_CAPSULE | ORAL | Status: AC
  Filled 2011-11-28: qty 1

## 2011-11-28 MED ORDER — DEXTROSE-NACL 5-0.45 % IV SOLN
INTRAVENOUS | Status: DC
  Administered 2011-11-28: 17:00:00 via INTRAVENOUS

## 2011-11-28 MED ORDER — DIPHENHYDRAMINE HCL 25 MG PO CAPS
25.0000 mg | ORAL_CAPSULE | ORAL | Status: AC
  Administered 2011-11-28: 25 mg via ORAL

## 2011-11-28 MED ORDER — DIAZEPAM 5 MG PO TABS
ORAL_TABLET | ORAL | Status: AC
  Filled 2011-11-28: qty 2

## 2011-11-28 MED ORDER — CLOPIDOGREL BISULFATE 75 MG PO TABS: 75.0000 mg | ORAL_TABLET | Freq: Every day | ORAL | Status: DC

## 2011-11-28 NOTE — Discharge Instructions (Signed)
Resume Plavix/Aspirin when your MD instructed you to (usually when urine stops having blood in it in about 48 hours). Call your MD [phone] with any problems or questions. Cipro and Percocet prescriptions given to you.    Lithotripsy Care After Refer to this sheet for the next few weeks. These discharge instructions provide you with general information on caring for yourself after you leave the hospital. Your caregiver may also give you specific instructions. Your treatment has been planned according to the most current medical practices available, but unavoidable complications sometimes occur. If you have any problems or questions after discharge, please call your caregiver. AFTER THE PROCEDURE   The recovery time will vary with the procedure done.   You will be taken to the recovery area. A nurse will watch and check your progress. Once you are awake, stable, and taking fluids well, you will be allowed to go home as long as there are no problems.   Your urine may have a red tinge for a few days after treatment. Blood loss is usually minimal.   You may have soreness in the back or flank area. This usually goes away after a few days. The procedure can cause blotches or bruises on the back where the pressure wave enters the skin. These marks usually cause only minimal discomfort and should disappear in a short time.   Stone fragments should begin to pass within 24 hours of treatment. However, a delayed passage is not unusual.   You may have pain, discomfort, and feel sick to your stomach (nauseous) when the crushed (pulverized) fragments of stone are passed down the tube from the kidney to the bladder. Stone fragments can pass soon after the procedure and may last for up to 4 to 8 weeks.   A small number of patients may have severe pain when stone fragments are not able to pass, which leads to an obstruction.   If your stone is greater than 1 inch/2.5 centimeters in diameter or if you have  multiple stones that have a combined diameter greater than 1 inch/2.5 centimeters, you may require more than 1 treatment.   You must have someone drive you home.  HOME CARE INSTRUCTIONS   Rest at home until you feel your energy improving.   Only take over-the-counter or prescription medicines for pain, discomfort, or fever as directed by your caregiver. Depending on the type of lithotripsy, you may need to take medicines (antibiotics) that kill germs and anti-inflammatory medicines for a few days.   Drink enough water and fluids to keep your urine clear or pale yellow. This helps "flush" your kidneys. It helps pass any remaining pieces of stone and prevents stones from coming back.   Most people can resume daily activities within 1 or 2 days after standard lithotripsy. It can take longer to recover from laser and percutaneous lithotripsy.   If the stones are in your urinary system, you may be asked to strain your urine at home to look for stones. Any stones that are found can be sent to a medical lab for examination.   Visit your caregiver for a follow-up appointment in a few weeks. Your doctor may remove your stent if you have one. Your caregiver will also check to see whether stone particles still remain.  SEEK MEDICAL CARE IF:   You have an oral temperature above 102 F (38.9 C).   Your pain is not relieved by medicine.   You have a lasting nauseous feeling.   You  feel there is too much blood in the urine.   You develop persistent problems with frequent and/or painful urination that does not at least partially improve after 2 days following the procedure.   You have a congested cough.   You feel lightheaded.   You develop a rash or any other signs that might suggest an allergic problem.   You develop any reaction or side effects to your medicine(s).  SEEK IMMEDIATE MEDICAL CARE IF:   You experience severe back and/or flank pain.   You see nothing but blood when you urinate.    You cannot pass any urine at all.   You have an oral temperature above 102 F (38.9 C), not controlled by medicine.   You develop shortness of breath, difficulty breathing, or chest pain.   You develop vomiting that will not stop after 6 to 8 hours.   You have a fainting episode.  MAKE SURE YOU:   Understand these instructions.   Will watch your condition.   Will get help right away if you are not doing well or get worse.  Document Released: 06/09/2007 Document Revised: 05/09/2011 Document Reviewed: 06/09/2007 [facility] Patient Information 2012 [place], [place].

## 2011-11-28 NOTE — Interval H&P Note (Signed)
History and Physical Interval Note:  11/28/2011 5:41 PM  [name]  has presented today for surgery, with the diagnosis of right ureteral pelvic junction   The various methods of treatment have been discussed with the patient and family. After consideration of risks, benefits and other options for treatment, the patient has consented to  Procedure(s) (LRB): EXTRACORPOREAL SHOCK WAVE LITHOTRIPSY (ESWL) (Right) as a surgical intervention .  The patient's history has been reviewed, patient examined, no change in status, stable for surgery.  I have reviewed the patients' chart and labs.  Questions were answered to the patient's satisfaction.     Antony Haste

## 2011-11-28 NOTE — H&P (View-Only) (Signed)
 F/u gross hematuria and nephrolithiasis. Pt has noticed red urine starting Nov 2012. He's also had right flank pain. He quit smoking 8 yrs ago, but smoked for 40 yrs. He has no chemo, XRT, or dye/solvent exposure.   He has a history of BPH and is on no treatment. [facility]. He took Flomax in the past. This caused a fast heart rate so he had to stop it. No other GU meds or surgery. He has some frequency. He has no urgency. He voids with a good stream. No intermittent flow or straining. He doesnt feel he needs therapy. No FH prostate cancer. Pt prostate 111 g on CT. He mass dried March 2013.  His frequency improved after an MI Feb 2012. He coded and was in ICU for 6 days. He needed a heart pump due to CHF. He had another MI Aug 2012. Both times he had PTCA and stents. He also had a small MI in 2005. He is on Effient and NTG.   He has a history of stones. He has had ESWL on the left about 20 yrs ago and was told there was a stone remaining and it would not pass because it was in a "sac". He had a left ureteral stent for this and it "about killed him". He was also told he had a right stone about 8 yrs ago. He's had no recent stone passage, but again had some right flank pain on and off.   CT A/P WITH contrast Jun 06, 2010 - I reviewed all images - 7 mm RLP stone, 10 mm LLP stone, no hydro, "mildly greater enhancement" of anterior collecting system (anterior renal pelvis and UPJ, smooth without filling defect on delayed images) and BPH with a median lobe ("possibility of small tumor").    Jan 2013 labs - Urine cx was negative, but grew 4K CFU. Bun 15, Cr 0.91, LFT's and alk phos normal.  Jan 2013 Cystoscopy was normal apart from BPH and a median lobe. Urine cytology atypical.  Feb 2013 CT A/P repeat - much improvement in "enhancement of right renal pelvis". Ureters normal on delayed. Prostate about 111 g.   Feb 2013 Urine cytology repeat - no malignant  cells  KUB 6/12/103 - right stone has moved into right UPJ. Left stone stable. Normal bowel gas. Normal bones.  Interval Hx Pt went to ER November 17, 2011 with severe right flank pain. KUB showed stone still at right UPJ. In consideration of future procedures, Pam called his cardiologist who is out of town for the week and his nurse thought the patient would not be able to be off both aspirin and Plavix. This would rule out shockwave as a treatment modality option.  His pain is improved today although he is taking pain medicine.   Past Medical History Problems  1. History of  Acute Myocardial Infarction V12.59 2. History of  Congestive Heart Failure 428.0 3. History of  Esophageal Reflux 530.81 4. History of  Gastric Ulcer 531.90 5. History of  Glaucoma 365.9 6. History of  Heart Disease 429.9 7. History of  Hypercholesterolemia 272.0 8. History of  Hypertension 401.9 9. History of  Nephrolithiasis V13.01 10. History of  Renal Failure 586  Surgical History Problems  1. History of  Elbow Surgery Right 2. History of  Inguinal Hernia Repair Right 3. History of  Lithotripsy 4. History of  Reported Hx Of Surgery For An Ulcer  Current   Meds 1. Aspirin 81 MG Oral Tablet; Therapy: (Recorded:11Jan2013) to 2. Carvedilol 6.25 MG Oral Tablet; Therapy: (Recorded:11Jan2013) to 3. Finasteride 5 MG Oral Tablet; Take 1 tablet by mouth every day; Therapy: 04Mar2013 to  (Evaluate:27Feb2014)  Requested for: 04Mar2013; Last Rx:04Mar2013 4. Nitrostat 0.4 MG Sublingual Tablet Sublingual; Therapy: (Recorded:11Jan2013) to 5. Omeprazole 20 MG Oral Tablet Delayed Release; Therapy: (Recorded:11Jan2013) to 6. Plavix TABS; Therapy: (Recorded:12Jun2013) to 7. Pravastatin Sodium 40 MG Oral Tablet; Therapy: (Recorded:11Jan2013) to  Allergies Medication  1. No Known Drug Allergies  Family History Problems  1. Family history of  Death In The Family Father pneumonia 2. Family history of  Death In The Family  Mother CHF 3. Family history of  Family Health Status Number Of Children No children  Social History Problems  1. Former Smoker V15.82 2. Marital History - Divorced V61.03 3. Occupation: Retired 4. History of  Tobacco Use 305.1 smoked 1 pp week x 40 years Denied  5. History of  Alcohol Use 6. History of  Caffeine Use  Review of Systems  Constitutional: no fever.  Cardiovascular: no chest pain.  Respiratory: no shortness of breath.    Vitals Vital Signs [Data Includes: Last 1 Day]  18Jun2013 10:47AM  Blood Pressure: 171 / 91 Temperature: 97.1 F Heart Rate: 50  Physical Exam Constitutional: Well nourished and well developed . No acute distress.  Pulmonary: No respiratory distress and normal respiratory rhythm and effort.  Cardiovascular: Heart rate and rhythm are normal . No peripheral edema.  Neuro/Psych:. Mood and affect are appropriate.    Results/Data Urine [Data Includes: Last 1 Day]   18Jun2013  COLOR YELLOW   APPEARANCE CLEAR   SPECIFIC GRAVITY <1.005   pH 6.0   GLUCOSE NEG mg/dL  BILIRUBIN NEG   KETONE NEG mg/dL  BLOOD LARGE   PROTEIN NEG mg/dL  UROBILINOGEN 0.2 mg/dL  NITRITE NEG   LEUKOCYTE ESTERASE NEG   SQUAMOUS EPITHELIAL/HPF NONE SEEN   WBC NONE SEEN WBC/hpf  RBC 4-6 RBC/hpf  BACTERIA NONE SEEN   CRYSTALS NONE SEEN   CASTS NONE SEEN    Assessment Assessed  1. Nephrolithiasis 592.0   Symptomatic right UPJ stone   Plan Health Maintenance (V70.0)  1. UA With REFLEX  Done: 18Jun2013 10:39AM Nephrolithiasis (592.0)  2. Follow-up Schedule Surgery Office  Follow-up  Done: 18Jun2013  Discussion/Summary  We discussed the nature risk and benefits of continued surveillance versus ureteral stent placement today. Patient does not want to proceed urgently today. Looking forward we discussed again the nature risk and benefits of cystoscopy with stent placement/possible ureteroscopy laser lithotripsy compared to shockwave lithotripsy. Again he is  hesitant to have a stent placed as he had one prior the gave him a lot of discomfort (urgency and frequency, flank pain). All questions answered. After careful consideration on both her parts will proceed with cystoscopy with right ureteral stent placement and ureteroscopy laser lithotripsy if unable to gain proximal access. We discussed this as a large stone in a likely need multiple procedures. We discussed surgical and anesthetic risks risks such as cardiopulmonary problems, death, ureteral injury, bleeding and infection among others. He reports he did have surgery on his arm in the last few months and did fine with anesthesia. I will try to use a Percuflex stent to limit discomfort.  He knows to call should he get any severe uncontrolled pain, nausea vomiting or fevers or chills.  cc: Dr. Copper, Dr. Nyland     Signatures Electronically signed by : Rory Xiang,   M.D.; Nov 19 2011 11:34AM  

## 2011-11-28 NOTE — Brief Op Note (Signed)
11/28/2011  6:15 PM  PATIENT:  [name]  66 y.o. male  PRE-OPERATIVE DIAGNOSIS:  right nephrolithiasis  POST-OPERATIVE DIAGNOSIS:   right nephrolithiasis  PROCEDURE:  Procedure(s) (LRB): EXTRACORPOREAL SHOCK WAVE LITHOTRIPSY (ESWL) (Right)  SURGEON:  Surgeon(s) and Role:    * Antony Haste, MD - Primary  PLAN OF CARE: Discharge to home after PACU  PATIENT DISPOSITION:  PACU - hemodynamically stable.   Delay start of Pharmacological VTE agent (>24hrs) due to surgical blood loss or risk of bleeding: yes

## 2011-12-10 ENCOUNTER — Encounter (HOSPITAL_COMMUNITY): Payer: Self-pay | Admitting: *Deleted

## 2011-12-10 ENCOUNTER — Ambulatory Visit (HOSPITAL_COMMUNITY): Admit: 2011-12-10 | Payer: Self-pay | Admitting: Urology

## 2011-12-10 ENCOUNTER — Ambulatory Visit (HOSPITAL_COMMUNITY): Payer: Medicare Other | Admitting: Anesthesiology

## 2011-12-10 ENCOUNTER — Encounter (HOSPITAL_COMMUNITY)
Admission: RE | Disposition: A | Discharge status: Home / Self Care | Payer: Self-pay | Source: Ambulatory Visit | Attending: Urology

## 2011-12-10 ENCOUNTER — Encounter (HOSPITAL_COMMUNITY): Payer: Self-pay | Admitting: Anesthesiology

## 2011-12-10 ENCOUNTER — Ambulatory Visit (HOSPITAL_COMMUNITY)
Admission: RE | Admit: 2011-12-10 | Discharge: 2011-12-10 | Disposition: A | Discharge status: Home / Self Care | Payer: Medicare Other | Source: Ambulatory Visit | Attending: Urology | Admitting: Urology

## 2011-12-10 ENCOUNTER — Other Ambulatory Visit: Payer: Self-pay | Admitting: Urology

## 2011-12-10 DIAGNOSIS — I252 Old myocardial infarction: Secondary | ICD-10-CM | POA: Insufficient documentation

## 2011-12-10 DIAGNOSIS — N201 Calculus of ureter: Secondary | ICD-10-CM | POA: Insufficient documentation

## 2011-12-10 DIAGNOSIS — K219 Gastro-esophageal reflux disease without esophagitis: Secondary | ICD-10-CM | POA: Insufficient documentation

## 2011-12-10 DIAGNOSIS — I2510 Atherosclerotic heart disease of native coronary artery without angina pectoris: Secondary | ICD-10-CM | POA: Insufficient documentation

## 2011-12-10 DIAGNOSIS — Z7982 Long term (current) use of aspirin: Secondary | ICD-10-CM | POA: Insufficient documentation

## 2011-12-10 DIAGNOSIS — Z79899 Other long term (current) drug therapy: Secondary | ICD-10-CM | POA: Insufficient documentation

## 2011-12-10 DIAGNOSIS — I10 Essential (primary) hypertension: Secondary | ICD-10-CM | POA: Insufficient documentation

## 2011-12-10 DIAGNOSIS — N2889 Other specified disorders of kidney and ureter: Secondary | ICD-10-CM | POA: Insufficient documentation

## 2011-12-10 DIAGNOSIS — E785 Hyperlipidemia, unspecified: Secondary | ICD-10-CM | POA: Insufficient documentation

## 2011-12-10 SURGERY — CYSTOSCOPY, WITH STENT INSERTION
Anesthesia: General | Site: Ureter | Laterality: Right | Wound class: Clean Contaminated

## 2011-12-10 SURGERY — Surgical Case
Anesthesia: *Unknown

## 2011-12-10 MED ORDER — BELLADONNA ALKALOIDS-OPIUM 16.2-60 MG RE SUPP
RECTAL | Status: AC
  Filled 2011-12-10: qty 1

## 2011-12-10 MED ORDER — 0.9 % SODIUM CHLORIDE (POUR BTL) OPTIME
TOPICAL | Status: DC | PRN
  Administered 2011-12-10: 1000 mL

## 2011-12-10 MED ORDER — MUPIROCIN 2 % EX OINT
TOPICAL_OINTMENT | CUTANEOUS | Status: AC
  Filled 2011-12-10: qty 22

## 2011-12-10 MED ORDER — FENTANYL CITRATE 0.05 MG/ML IJ SOLN: 25.0000 ug | INTRAMUSCULAR | Status: DC | PRN

## 2011-12-10 MED ORDER — BELLADONNA ALKALOIDS-OPIUM 16.2-60 MG RE SUPP
RECTAL | Status: DC | PRN
  Administered 2011-12-10: 1 via RECTAL

## 2011-12-10 MED ORDER — FENTANYL CITRATE 0.05 MG/ML IJ SOLN
50.0000 ug | INTRAMUSCULAR | Status: DC | PRN
  Administered 2011-12-10 (×4): 50 ug via INTRAVENOUS

## 2011-12-10 MED ORDER — CEFAZOLIN SODIUM-DEXTROSE 2-3 GM-% IV SOLR: 2.0000 g | INTRAVENOUS | Status: DC

## 2011-12-10 MED ORDER — FENTANYL CITRATE 0.05 MG/ML IJ SOLN
INTRAMUSCULAR | Status: DC | PRN
  Administered 2011-12-10: 25 ug via INTRAVENOUS

## 2011-12-10 MED ORDER — PROPOFOL 10 MG/ML IV BOLUS
INTRAVENOUS | Status: DC | PRN
  Administered 2011-12-10: 110 mg via INTRAVENOUS

## 2011-12-10 MED ORDER — OXYCODONE-ACETAMINOPHEN 5-325 MG PO TABS: 1.0000 | ORAL_TABLET | ORAL | Status: AC | PRN

## 2011-12-10 MED ORDER — IOHEXOL 300 MG/ML  SOLN: INTRAMUSCULAR | Status: DC | PRN

## 2011-12-10 MED ORDER — STERILE WATER FOR IRRIGATION IR SOLN
Status: DC | PRN
  Administered 2011-12-10: 3000 mL

## 2011-12-10 MED ORDER — LIDOCAINE HCL 2 % EX GEL
CUTANEOUS | Status: AC
  Filled 2011-12-10: qty 10

## 2011-12-10 MED ORDER — IOHEXOL 300 MG/ML  SOLN
INTRAMUSCULAR | Status: AC
  Filled 2011-12-10: qty 1

## 2011-12-10 MED ORDER — ONDANSETRON HCL 4 MG/2ML IJ SOLN
INTRAMUSCULAR | Status: DC | PRN
  Administered 2011-12-10: 4 mg via INTRAVENOUS

## 2011-12-10 MED ORDER — LACTATED RINGERS IV SOLN
INTRAVENOUS | Status: DC
  Administered 2011-12-10: 1000 mL via INTRAVENOUS

## 2011-12-10 MED ORDER — ONDANSETRON HCL 4 MG PO TABS: 4.0000 mg | ORAL_TABLET | Freq: Three times a day (TID) | ORAL | Status: AC | PRN

## 2011-12-10 MED ORDER — FENTANYL CITRATE 0.05 MG/ML IJ SOLN
INTRAMUSCULAR | Status: AC
  Filled 2011-12-10: qty 2

## 2011-12-10 MED ORDER — LACTATED RINGERS IV SOLN: INTRAVENOUS | Status: DC

## 2011-12-10 MED ORDER — CEFAZOLIN SODIUM-DEXTROSE 2-3 GM-% IV SOLR
INTRAVENOUS | Status: AC
  Filled 2011-12-10: qty 50

## 2011-12-10 MED ORDER — CARVEDILOL 6.25 MG PO TABS
6.2500 mg | ORAL_TABLET | Freq: Once | ORAL | Status: AC
  Administered 2011-12-10: 6.25 mg via ORAL
  Filled 2011-12-10: qty 1

## 2011-12-10 MED ORDER — LIDOCAINE HCL (CARDIAC) 20 MG/ML IV SOLN
INTRAVENOUS | Status: DC | PRN
  Administered 2011-12-10: 30 mg via INTRAVENOUS

## 2011-12-10 SURGICAL SUPPLY — 16 items
ADAPTER CATH URET PLST 4-6FR (CATHETERS) ×3 IMPLANT
BAG URO CATCHER STRL LF (DRAPE) ×3 IMPLANT
CATH URET WHISTLE 6FR (CATHETERS) IMPLANT
CLOTH BEACON ORANGE TIMEOUT ST (SAFETY) ×3 IMPLANT
DRAPE CAMERA CLOSED 9X96 (DRAPES) ×3 IMPLANT
GLOVE BIOGEL M 8.0 STRL (GLOVE) ×3 IMPLANT
GLOVE SURG SS PI 6.5 STRL IVOR (GLOVE) ×3 IMPLANT
GOWN PREVENTION PLUS XLARGE (GOWN DISPOSABLE) ×3 IMPLANT
GOWN STRL REIN XL XLG (GOWN DISPOSABLE) ×3 IMPLANT
GUIDEWIRE STR DUAL SENSOR (WIRE) ×3 IMPLANT
MANIFOLD NEPTUNE II (INSTRUMENTS) ×3 IMPLANT
NS IRRIG 1000ML POUR BTL (IV SOLUTION) ×3 IMPLANT
PACK CYSTO (CUSTOM PROCEDURE TRAY) ×3 IMPLANT
STENT CONTOUR 6FRX24X.038 (STENTS) ×3 IMPLANT
TUBING CONNECTING 10 (TUBING) ×3 IMPLANT
WATER STERILE IRR 3000ML UROMA (IV SOLUTION) ×3 IMPLANT

## 2011-12-10 NOTE — Transfer of Care (Signed)
Immediate Anesthesia Transfer of Care Note  Patient: [name]  Procedure(s) Performed: Procedure(s) (LRB): CYSTOSCOPY WITH STENT PLACEMENT (Right)  Patient Location: PACU  Anesthesia Type: General  Level of Consciousness: sedated, patient cooperative and responds to stimulaton  Airway & Oxygen Therapy: Patient Spontanous Breathing and Patient connected to face mask oxgen  Post-op Assessment: Report given to PACU RN and Post -op Vital signs reviewed and stable  Post vital signs: Reviewed and stable  Complications: No apparent anesthesia complications

## 2011-12-10 NOTE — Anesthesia Postprocedure Evaluation (Signed)
  Anesthesia Post-op Note  Patient: [name]  Procedure(s) Performed: Procedure(s) (LRB): CYSTOSCOPY WITH STENT PLACEMENT (Right)  Patient Location: PACU  Anesthesia Type: General  Level of Consciousness: awake and alert   Airway and Oxygen Therapy: Patient Spontanous Breathing  Post-op Pain: mild  Post-op Assessment: Post-op Vital signs reviewed, Patient's Cardiovascular Status Stable, Respiratory Function Stable, Patent Airway and No signs of Nausea or vomiting  Post-op Vital Signs: stable  Complications: No apparent anesthesia complications

## 2011-12-10 NOTE — Op Note (Signed)
Preoperative diagnosis:  1. Status post right sided lithotripsy with obstructing right ureteral stones and perirenal hematoma   Postoperative diagnosis:  1. Same   Procedure:  1. Cystoscopy 2. Right ureteral stent placement 6 French contour, 24 cm without tether  Surgeon: Bertram Millard. Ramsay Bognar  M.D.  Anesthesia: General  Complications: None  Intraoperative findings: Normal bladder  EBL: Minimal  Specimens: None  Indication: [name] is a 66 y.o. patient with obstructing right ureteral fragments following lithotripsy in late June. He has significant pain, and a small perirenal hematoma. After reviewing the management options for treatment, he/she elected to proceed with the above surgical procedure(s). We have discussed the potential benefits and risks of the procedure, side effects of the proposed treatment, the likelihood of the patient achieving the goals of the procedure, and any potential problems that might occur during the procedure or recuperation. Informed consent has been obtained.  Description of procedure:  The patient was identified and properly marked in the holding area. He received preoperative IV antibiotics. The patient was taken to the operating room and general anesthesia was induced.  The patient was placed in the dorsal lithotomy position, prepped and draped in the usual sterile fashion, and preoperative antibiotics were administered. A preoperative time-out was performed.   Cystourethroscopy was performed.  The patient's urethra was examined and was normal/ demonstrated bilobar prostatic hypertrophy/ bilobar prostatic hypertrophy with a median lobe there was slightly enlarged. The bladder was then systematically examined in its entirety. There was no evidence for any bladder tumors, stones, or other mucosal pathology.    Attention then turned to the right ureteral orifice and a ureteral catheter was used to intubate the ureteral orifice.    A 0.38 sensor  guidewire was then advanced up the right ureter into the renal pelvis under fluoroscopic guidance.  The wire was then backloaded through the cystoscope and a ureteral stent was advance over the wire using Seldinger technique.  The stent was positioned appropriately under fluoroscopic and cystoscopic guidance.  The wire was then removed with an adequate stent curl noted in the renal pelvis as well as in the bladder.  The bladder was then emptied and the procedure ended.  The patient appeared to tolerate the procedure well and without complications.  The patient was able to be awakened and transferred to the recovery unit in satisfactory condition.    Bertram Millard. Retta Diones, MD
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
## 2011-12-10 NOTE — Anesthesia Preprocedure Evaluation (Addendum)
Anesthesia Evaluation  Patient identified by MRN, date of birth, ID band Patient awake    Reviewed: Allergy & Precautions, H&P , NPO status , Patient's Chart, lab work & pertinent test results, reviewed documented beta blocker date and time   Airway Mallampati: III TM Distance: >3 FB Neck ROM: full    Dental  (+) Caps and Dental Advisory Given,    Pulmonary neg pulmonary ROS,  breath sounds clear to auscultation  Pulmonary exam normal       Cardiovascular Exercise Tolerance: Good hypertension, Pt. on home beta blockers and Pt. on medications + CAD, + Past MI and + Cardiac Stents negative cardio ROS  Rhythm:regular Rate:Normal  MI x 3. 2/12 and 8/12. Stents placed.   Neuro/Psych negative neurological ROS  negative psych ROS   GI/Hepatic negative GI ROS, Neg liver ROS, GERD-  Medicated and Controlled,  Endo/Other  negative endocrine ROS  Renal/GU negative Renal ROS  negative genitourinary   Musculoskeletal   Abdominal   Peds  Hematology negative hematology ROS (+)   Anesthesia Other Findings   Reproductive/Obstetrics negative OB ROS                           Anesthesia Physical Anesthesia Plan  ASA: III  Anesthesia Plan: General   Post-op Pain Management:    Induction: Intravenous  Airway Management Planned: LMA  Additional Equipment:   Intra-op Plan:   Post-operative Plan:   Informed Consent: I have reviewed the patients History and Physical, chart, labs and discussed the procedure including the risks, benefits and alternatives for the proposed anesthesia with the patient or authorized representative who has indicated his/her understanding and acceptance.   Dental Advisory Given  Plan Discussed with: Surgeon  Anesthesia Plan Comments:         Anesthesia Quick Evaluation

## 2011-12-13 LAB — MRSA CULTURE

## 2011-12-25 ENCOUNTER — Other Ambulatory Visit: Payer: Self-pay | Admitting: Urology

## 2011-12-26 ENCOUNTER — Encounter (HOSPITAL_COMMUNITY): Payer: Self-pay | Admitting: Pharmacy Technician

## 2011-12-27 ENCOUNTER — Encounter (HOSPITAL_COMMUNITY)
Admission: RE | Admit: 2011-12-27 | Discharge: 2011-12-27 | Disposition: A | Discharge status: Home / Self Care | Payer: Medicare Other | Source: Ambulatory Visit | Attending: Urology | Admitting: Urology

## 2011-12-27 ENCOUNTER — Encounter (HOSPITAL_COMMUNITY): Payer: Self-pay

## 2011-12-27 HISTORY — PX: CYSTOSCOPY W/ URETERAL STENT PLACEMENT: SHX1429

## 2011-12-27 HISTORY — PX: EXTRACORPOREAL SHOCK WAVE LITHOTRIPSY: SHX1557

## 2011-12-27 LAB — BASIC METABOLIC PANEL
CO2: 24 mEq/L (ref 19–32)
Calcium: 9.3 mg/dL (ref 8.4–10.5)
Creatinine, Ser: 0.96 mg/dL (ref 0.50–1.35)
GFR calc non Af Amer: 84 mL/min — ABNORMAL LOW (ref >90)
Glucose, Bld: 106 mg/dL — ABNORMAL HIGH (ref 70–99)
Sodium: 140 mEq/L (ref 135–145)

## 2011-12-27 LAB — CBC
MCH: 28.8 pg (ref 26.0–34.0)
MCHC: 33.2 g/dL (ref 30.0–36.0)
MCV: 86.9 fL (ref 78.0–100.0)
Platelets: 321 10*3/uL (ref 150–400)

## 2011-12-27 LAB — SURGICAL PCR SCREEN: Staphylococcus aureus: NEGATIVE

## 2011-12-27 LAB — APTT: aPTT: 31 seconds (ref 24–37)

## 2011-12-27 LAB — PROTIME-INR: Prothrombin Time: 13.6 seconds (ref 11.6–15.2)

## 2011-12-27 NOTE — Patient Instructions (Addendum)
20 [name]  12/27/2011   Your procedure is scheduled on:   12-31-2011  Report to [facility] at     0630   AM.  Call this number if you have problems the morning of surgery: [phone] or prior to: [phone] Presurgical testing   Remember:   Do not eat food:After Midnight.  Take these medicines the morning of surgery with A SIP OF WATER: Carvedilol   Do not wear jewelry, make-up or nail polish.  Do not wear lotions, powders, or perfumes. You may wear deodorant.  Do not shave 48 hours prior to surgery.(face and neck okay, no shaving of legs)  Do not bring valuables to the hospital.  Contacts, dentures or bridgework may not be worn into surgery.  Leave suitcase in the car. After surgery it may be brought to your room.  For patients admitted to the hospital, checkout time is 11:00 AM the day of discharge.   Patients discharged the day of surgery will not be allowed to drive home.  Name and phone number of your driver:  Jamey Reas Special Instructions: CHG Shower Use Special Wash: 1/2 bottle night before surgery and 1/2 bottle morning of surgery.(avoid face and genitals)   Please read over the following fact sheets that you were given: MRSA Information.

## 2011-12-27 NOTE — Pre-Procedure Instructions (Signed)
12-27-11 EKG(08-21-11), CXR(11-17-11) reports with chart.W. Kennon Portela

## 2011-12-30 NOTE — H&P (Signed)
History of Present Illness  F/u gross hematuria and nephrolithiasis. Pt has noticed red urine starting Nov 2012. He's also had right flank pain. He quit smoking 8 yrs ago, but smoked for 40 yrs. He has no chemo, XRT, or dye/solvent exposure.   He has a history of BPH and is on no treatment. He has been given medicine for it at the [place] several times. He took Flomax in the past. This caused a fast heart rate so he had to stop it. No other GU meds or surgery. He has some frequency. He has no urgency. He voids with a good stream. No intermittent flow or straining. He doesnt feel he needs therapy. No FH prostate cancer. Pt prostate 111 g on CT. He mass dried March 2013.  His frequency improved after an MI 2010-08-22. He coded and was in ICU for 6 days. He needed a heart pump due to CHF. He had another MI Aug 2012. Both times he had PTCA and stents. He also had a small MI in 2005. He is on Effient and NTG.   He has a history of stones. He has had ESWL on the left about 20 yrs ago and was told there was a stone remaining and it would not pass because it was in a "sac". He had a left ureteral stent for this and it "about killed him". He was also told he had a right stone about 8 yrs ago. He's had no recent stone passage, but again had some right flank pain on and off.   CT A/P WITH contrast Jun 06, 2010 - I reviewed all images - 7 mm RLP stone, 10 mm LLP stone, no hydro, "mildly greater enhancement" of anterior collecting system (anterior renal pelvis and UPJ, smooth without filling defect on delayed images) and BPH with a median lobe ("possibility of small tumor").    Jan 2013 labs - Urine cx was negative, but grew 4K CFU. Bun 15, Cr 0.91, LFT's and alk phos normal.  Jan 2013 Cystoscopy was normal apart from BPH and a median lobe. Urine cytology atypical.  Feb 2013 CT A/P repeat - much improvement in "enhancement of right renal pelvis". Ureters normal on delayed. Prostate about 111 g. A repeat Urine cytology  repeat - no malignant cells  KUB 6/12/103 - right stone has moved into right UPJ. Left stone stable. Normal bowel gas. Normal bones.  He went to the emergency room November 07 2011 with severe right flank pain and a KUB showed the stone still at the right UPJ.  November 28, 2011 he underwent right ESWL. He had post-op flank pain and a small right perirenal hematoma. He was taken for cystoscopy and right ureteral stent placement December 10, 2011  Interval Hx He returns and has had a complicated postop course from shockwave lithotripsy. He is doing well with the right ureteral stent. He has some hematuria, but no clots. He has some mild dysuria.  KUB - the right ureteral stent is in good position. There is a large stone fragment adjacent to the proximal pole of the stent. Otherwise the abdomen appeared normal.   Past Medical History Problems  1. History of  Acute Myocardial Infarction V12.59 2. History of  Congestive Heart Failure 428.0 3. History of  Esophageal Reflux 530.81 4. History of  Gastric Ulcer 531.90 5. History of  Glaucoma 365.9 6. History of  Heart Disease 429.9 7. History of  Hypercholesterolemia 272.0 8. History of  Hypertension 401.9 9. History of  Nephrolithiasis V13.01 10. History of  Renal Failure 586  Surgical History Problems  1. History of  Cystoscopy With Insertion Of Ureteral Stent Right 2. History of  Elbow Surgery Right 3. History of  Inguinal Hernia Repair Right 4. History of  Lithotripsy 5. History of  Lithotripsy 6. History of  Reported Hx Of Surgery For An Ulcer  Current Meds 1. Aspirin 81 MG Oral Tablet; Therapy: (Recorded:11Jan2013) to 2. Carvedilol 6.25 MG Oral Tablet; Therapy: (Recorded:11Jan2013) to 3. Finasteride 5 MG Oral Tablet; Take 1 tablet by mouth every day; Therapy: 04Mar2013 to  (Evaluate:27Feb2014)  Requested for: 04Mar2013; Last Rx:04Mar2013 4. Nitrostat 0.4 MG Sublingual Tablet Sublingual; Therapy: (Recorded:11Jan2013) to 5. Omeprazole 20 MG  Oral Tablet Delayed Release; Therapy: (Recorded:11Jan2013) to 6. Oxycodone-Acetaminophen 5-325 MG Oral Tablet; Therapy: 16Jun2013 to 7. Plavix TABS; Therapy: (Recorded:12Jun2013) to 8. Pravastatin Sodium 40 MG Oral Tablet; Therapy: (Recorded:11Jan2013) to 9. Tamsulosin HCl 0.4 MG Oral Capsule; TAKE 1 CAPSULE Daily to facilitate stone passage;  Therapy: 09Jul2013 to (Evaluate:07Oct2013)  Requested for: 09Jul2013; Last Rx:09Jul2013 10. VESIcare 5 MG Oral Tablet; TAKE 1 TABLET Other once or twice daily as needed for stent   pain/spasms; Therapy: 09Jul2013 to (Evaluate:07Sep2013); Last Rx:09Jul2013  Allergies Medication  1. No Known Drug Allergies  Family History Problems  1. Family history of  Death In The Family Father pneumonia 2. Family history of  Death In The Family Mother CHF 3. Family history of  Family Health Status Number Of Children No children  Social History Problems  1. Former Smoker V15.82 2. Marital History - Divorced V61.03 3. Occupation: Retired 4. History of  Tobacco Use 305.1 smoked 1 pp week x 40 years Denied  5. History of  Alcohol Use 6. History of  Caffeine Use  Review of Systems Constitutional, skin, eye, otolaryngeal, hematologic/lymphatic, cardiovascular, pulmonary, endocrine, musculoskeletal, gastrointestinal, neurological and psychiatric system(s) were reviewed and pertinent findings if present are noted.    Vitals Vital Signs [Data Includes: Last 1 Day]  24Jul2013 01:33PM  Blood Pressure: 143 / 77 Temperature: 97.2 F Heart Rate: 54  Physical Exam Constitutional: Well nourished and well developed . No acute distress.  Pulmonary: No respiratory distress and normal respiratory rhythm and effort.  Cardiovascular: Heart rate and rhythm are normal . No peripheral edema.  Neuro/Psych:. Mood and affect are appropriate.    Results/Data Urine [Data Includes: Last 1 Day]   24Jul2013  COLOR RED   APPEARANCE TURBID   SPECIFIC GRAVITY 1.025   pH 6.5    GLUCOSE NEG mg/dL  BILIRUBIN NEG   KETONE NEG mg/dL  BLOOD LARGE   PROTEIN 100 mg/dL  UROBILINOGEN 1 mg/dL  NITRITE POS   LEUKOCYTE ESTERASE SMALL   SQUAMOUS EPITHELIAL/HPF NONE SEEN   WBC NONE SEEN WBC/hpf  RBC TNTC RBC/hpf  BACTERIA RARE   CRYSTALS NONE SEEN   CASTS NONE SEEN    Assessment Assessed  1. Proximal Ureteral Stone On The Right 592.1  Plan Health Maintenance (V70.0)  1. UA With REFLEX  Done: 24Jul2013 01:03PM Proximal Ureteral Stone On The Right (592.1)  2. KUB  Done: 24Jul2013 12:00AM 3. Follow-up Schedule Surgery Office  Follow-up  Done: 24Jul2013 Pyuria (791.9)  4. URINE CULTURE  Done: 24Jul2013  Discussion/Summary  We discussed the KUB findings. He is back on Plavix and ASA. We discussed the nature risks and benefits of stent removal today and stone passage. Or proceeding with URS/laser lithotripsy/stone removal/stent placement. All questions answered and he elects to proceed with ureteroscopy. We again discussed the  likelihood of achieving the goals of the procedure and potential problems that might occur during the procedure or recuperation.   Urine was sent for culture as a precaution.   cc: Dr. Tonny Bollman; Dr. Jackolyn Confer Electronically signed by : Jerilee Field, M.D.; Dec 26 2011  2:32PM

## 2011-12-31 ENCOUNTER — Encounter (HOSPITAL_COMMUNITY)
Admission: RE | Disposition: A | Discharge status: Home / Self Care | Payer: Self-pay | Source: Ambulatory Visit | Attending: Urology

## 2011-12-31 ENCOUNTER — Ambulatory Visit (HOSPITAL_COMMUNITY)
Admission: RE | Admit: 2011-12-31 | Discharge: 2011-12-31 | Disposition: A | Discharge status: Home / Self Care | Payer: Medicare Other | Source: Ambulatory Visit | Attending: Urology | Admitting: Urology

## 2011-12-31 ENCOUNTER — Encounter (HOSPITAL_COMMUNITY): Payer: Self-pay | Admitting: Anesthesiology

## 2011-12-31 ENCOUNTER — Encounter (HOSPITAL_COMMUNITY): Payer: Self-pay | Admitting: *Deleted

## 2011-12-31 ENCOUNTER — Ambulatory Visit (HOSPITAL_COMMUNITY): Payer: Medicare Other | Admitting: Anesthesiology

## 2011-12-31 DIAGNOSIS — Z01812 Encounter for preprocedural laboratory examination: Secondary | ICD-10-CM | POA: Insufficient documentation

## 2011-12-31 DIAGNOSIS — Z7982 Long term (current) use of aspirin: Secondary | ICD-10-CM | POA: Insufficient documentation

## 2011-12-31 DIAGNOSIS — N201 Calculus of ureter: Secondary | ICD-10-CM | POA: Insufficient documentation

## 2011-12-31 DIAGNOSIS — I10 Essential (primary) hypertension: Secondary | ICD-10-CM | POA: Insufficient documentation

## 2011-12-31 DIAGNOSIS — N400 Enlarged prostate without lower urinary tract symptoms: Secondary | ICD-10-CM | POA: Insufficient documentation

## 2011-12-31 DIAGNOSIS — N200 Calculus of kidney: Secondary | ICD-10-CM | POA: Insufficient documentation

## 2011-12-31 DIAGNOSIS — I252 Old myocardial infarction: Secondary | ICD-10-CM | POA: Insufficient documentation

## 2011-12-31 DIAGNOSIS — I509 Heart failure, unspecified: Secondary | ICD-10-CM | POA: Insufficient documentation

## 2011-12-31 DIAGNOSIS — K219 Gastro-esophageal reflux disease without esophagitis: Secondary | ICD-10-CM | POA: Insufficient documentation

## 2011-12-31 DIAGNOSIS — E7800 Pure hypercholesterolemia, unspecified: Secondary | ICD-10-CM | POA: Insufficient documentation

## 2011-12-31 DIAGNOSIS — Z79899 Other long term (current) drug therapy: Secondary | ICD-10-CM | POA: Insufficient documentation

## 2011-12-31 SURGERY — CYSTOURETEROSCOPY, WITH RETROGRADE PYELOGRAM AND STENT INSERTION
Anesthesia: General | Site: Ureter | Laterality: Right | Wound class: Clean Contaminated

## 2011-12-31 MED ORDER — PROPOFOL 10 MG/ML IV BOLUS
INTRAVENOUS | Status: DC | PRN
  Administered 2011-12-31: 200 mg via INTRAVENOUS

## 2011-12-31 MED ORDER — BELLADONNA ALKALOIDS-OPIUM 16.2-60 MG RE SUPP
RECTAL | Status: DC | PRN
  Administered 2011-12-31: 1 via RECTAL

## 2011-12-31 MED ORDER — HYDROMORPHONE HCL PF 1 MG/ML IJ SOLN
INTRAMUSCULAR | Status: AC
  Filled 2011-12-31: qty 1

## 2011-12-31 MED ORDER — SODIUM CHLORIDE 0.9 % IR SOLN
Status: DC | PRN
  Administered 2011-12-31: 3000 mL

## 2011-12-31 MED ORDER — OXYCODONE-ACETAMINOPHEN 5-325 MG PO TABS: 1.0000 | ORAL_TABLET | ORAL | Status: AC | PRN

## 2011-12-31 MED ORDER — FENTANYL CITRATE 0.05 MG/ML IJ SOLN: 25.0000 ug | INTRAMUSCULAR | Status: DC | PRN

## 2011-12-31 MED ORDER — PROMETHAZINE HCL 25 MG/ML IJ SOLN: 6.2500 mg | INTRAMUSCULAR | Status: DC | PRN

## 2011-12-31 MED ORDER — DOCUSATE SODIUM 100 MG PO CAPS: 100.0000 mg | ORAL_CAPSULE | Freq: Two times a day (BID) | ORAL | Status: AC

## 2011-12-31 MED ORDER — MIDAZOLAM HCL 5 MG/5ML IJ SOLN
INTRAMUSCULAR | Status: DC | PRN
  Administered 2011-12-31: 2 mg via INTRAVENOUS

## 2011-12-31 MED ORDER — FENTANYL CITRATE 0.05 MG/ML IJ SOLN
INTRAMUSCULAR | Status: DC | PRN
  Administered 2011-12-31: 50 ug via INTRAVENOUS

## 2011-12-31 MED ORDER — MEPERIDINE HCL 50 MG/ML IJ SOLN: 6.2500 mg | INTRAMUSCULAR | Status: DC | PRN

## 2011-12-31 MED ORDER — DEXAMETHASONE SODIUM PHOSPHATE 10 MG/ML IJ SOLN
INTRAMUSCULAR | Status: DC | PRN
  Administered 2011-12-31: 10 mg via INTRAVENOUS

## 2011-12-31 MED ORDER — CIPROFLOXACIN HCL 250 MG PO TABS: 250.0000 mg | ORAL_TABLET | Freq: Two times a day (BID) | ORAL | Status: AC

## 2011-12-31 MED ORDER — CEFAZOLIN SODIUM-DEXTROSE 2-3 GM-% IV SOLR
INTRAVENOUS | Status: AC
  Filled 2011-12-31: qty 50

## 2011-12-31 MED ORDER — IOHEXOL 300 MG/ML  SOLN
INTRAMUSCULAR | Status: AC
  Filled 2011-12-31: qty 1

## 2011-12-31 MED ORDER — LACTATED RINGERS IV SOLN: INTRAVENOUS | Status: DC

## 2011-12-31 MED ORDER — CEFAZOLIN SODIUM-DEXTROSE 2-3 GM-% IV SOLR
2.0000 g | Freq: Once | INTRAVENOUS | Status: AC
  Administered 2011-12-31: 2 g via INTRAVENOUS

## 2011-12-31 MED ORDER — CLOPIDOGREL BISULFATE 75 MG PO TABS: 75.0000 mg | ORAL_TABLET | Freq: Every morning | ORAL | Status: DC

## 2011-12-31 MED ORDER — BELLADONNA ALKALOIDS-OPIUM 16.2-60 MG RE SUPP
RECTAL | Status: AC
  Filled 2011-12-31: qty 1

## 2011-12-31 MED ORDER — ACETAMINOPHEN 10 MG/ML IV SOLN
INTRAVENOUS | Status: AC
  Filled 2011-12-31: qty 100

## 2011-12-31 MED ORDER — LIDOCAINE HCL (CARDIAC) 20 MG/ML IV SOLN
INTRAVENOUS | Status: DC | PRN
  Administered 2011-12-31: 100 mg via INTRAVENOUS

## 2011-12-31 MED ORDER — ONDANSETRON HCL 4 MG/2ML IJ SOLN
INTRAMUSCULAR | Status: DC | PRN
  Administered 2011-12-31: 4 mg via INTRAVENOUS

## 2011-12-31 MED ORDER — ACETAMINOPHEN 10 MG/ML IV SOLN
INTRAVENOUS | Status: DC | PRN
  Administered 2011-12-31: 1000 mg via INTRAVENOUS

## 2011-12-31 MED ORDER — HYDROMORPHONE HCL PF 1 MG/ML IJ SOLN
0.2500 mg | INTRAMUSCULAR | Status: DC | PRN
  Administered 2011-12-31 (×2): 0.5 mg via INTRAVENOUS

## 2011-12-31 MED ORDER — EPHEDRINE SULFATE 50 MG/ML IJ SOLN
INTRAMUSCULAR | Status: DC | PRN
  Administered 2011-12-31 (×2): 5 mg via INTRAVENOUS

## 2011-12-31 MED ORDER — LACTATED RINGERS IV SOLN
INTRAVENOUS | Status: DC | PRN
  Administered 2011-12-31 (×2): via INTRAVENOUS

## 2011-12-31 SURGICAL SUPPLY — 33 items
ADAPTER CATH URET PLST 4-6FR (CATHETERS) IMPLANT
BAG URO CATCHER STRL LF (DRAPE) ×2 IMPLANT
BASKET LASER NITINOL 1.9FR (BASKET) IMPLANT
BASKET STNLS GEMINI 4WIRE 3FR (BASKET) ×2 IMPLANT
BASKET ZERO TIP NITINOL 2.4FR (BASKET) ×2 IMPLANT
BRUSH URET BIOPSY 3F (UROLOGICAL SUPPLIES) IMPLANT
CANISTER SUCT LVC 12 LTR MEDI- (MISCELLANEOUS) IMPLANT
CATH INTERMIT  6FR 70CM (CATHETERS) IMPLANT
CATH URET 5FR 28IN CONE TIP (BALLOONS)
CATH URET 5FR 28IN OPEN ENDED (CATHETERS) IMPLANT
CATH URET 5FR 70CM CONE TIP (BALLOONS) IMPLANT
CLOTH BEACON ORANGE TIMEOUT ST (SAFETY) ×2 IMPLANT
DRAPE CAMERA CLOSED 9X96 (DRAPES) IMPLANT
ELECT REM PT RETURN 9FT ADLT (ELECTROSURGICAL)
ELECTRODE REM PT RTRN 9FT ADLT (ELECTROSURGICAL) IMPLANT
GLOVE BIO SURGEON STRL SZ7.5 (GLOVE) ×2 IMPLANT
GOWN PREVENTION PLUS LG XLONG (DISPOSABLE) IMPLANT
GOWN STRL REIN XL XLG (GOWN DISPOSABLE) ×2 IMPLANT
GUIDEWIRE 0.038 PTFE COATED (WIRE) IMPLANT
GUIDEWIRE ANG ZIPWIRE 038X150 (WIRE) ×2 IMPLANT
GUIDEWIRE STR DUAL SENSOR (WIRE) ×2 IMPLANT
IV NS IRRIG 3000ML ARTHROMATIC (IV SOLUTION) ×2 IMPLANT
KIT BALLIN UROMAX 15FX10 (LABEL) IMPLANT
KIT BALLN UROMAX 15FX4 (MISCELLANEOUS) IMPLANT
KIT BALLN UROMAX 26 75X4 (MISCELLANEOUS)
LASER FIBER DISP (UROLOGICAL SUPPLIES) ×2 IMPLANT
PACK CYSTO (CUSTOM PROCEDURE TRAY) ×2 IMPLANT
SET HIGH PRES BAL DIL (LABEL)
SHEATH ACCESS URETERAL 38CM (SHEATH) ×2 IMPLANT
SHEATH URET ACCESS 12FR/35CM (UROLOGICAL SUPPLIES) IMPLANT
SHEATH URET ACCESS 12FR/55CM (UROLOGICAL SUPPLIES) IMPLANT
STENT CONTOUR 6FRX26X.038 (STENTS) ×2 IMPLANT
SYRINGE IRR TOOMEY STRL 70CC (SYRINGE) IMPLANT

## 2011-12-31 NOTE — Transfer of Care (Signed)
Immediate Anesthesia Transfer of Care Note  Patient: [name]  Procedure(s) Performed: Procedure(s) (LRB): CYSTOSCOPY WITH RETROGRADE PYELOGRAM, URETEROSCOPY AND STENT PLACEMENT (Right)  Patient Location: PACU  Anesthesia Type: General  Level of Consciousness: sedated  Airway & Oxygen Therapy: Patient Spontanous Breathing and Patient connected to face mask oxygen  Post-op Assessment: Report given to PACU RN and Post -op Vital signs reviewed and stable  Post vital signs: Reviewed and stable  Complications: No apparent anesthesia complications

## 2011-12-31 NOTE — Anesthesia Postprocedure Evaluation (Signed)
  Anesthesia Post-op Note  Patient: [name]  Procedure(s) Performed: Procedure(s) (LRB): CYSTOSCOPY WITH RETROGRADE PYELOGRAM, URETEROSCOPY AND STENT PLACEMENT (Right)  Patient Location: PACU  Anesthesia Type: General  Level of Consciousness: awake and alert   Airway and Oxygen Therapy: Patient Spontanous Breathing  Post-op Pain: mild  Post-op Assessment: Post-op Vital signs reviewed, Patient's Cardiovascular Status Stable, Respiratory Function Stable, Patent Airway and No signs of Nausea or vomiting  Post-op Vital Signs: stable  Complications: No apparent anesthesia complications

## 2011-12-31 NOTE — Interval H&P Note (Signed)
History and Physical Interval Note:  12/31/2011 8:52 AM  [name]  has presented today for surgery, with the diagnosis of Right Proximal Ureteral Stone  The various methods of treatment have been discussed with the patient and family. After consideration of risks, benefits and other options for treatment, the patient has consented to  Procedure(s) (LRB): CYSTOSCOPY WITH RETROGRADE PYELOGRAM, URETEROSCOPY AND STENT PLACEMENT (Right) as a surgical intervention .  The patient's history has been reviewed, patient examined, no change in status, stable for surgery.  I have reviewed the patient's chart and labs.  Questions were answered to the patient's satisfaction.     Antony Haste

## 2011-12-31 NOTE — Op Note (Signed)
Preoperative diagnosis: Right ureteral and renal stones  Postoperative diagnosis: Right ureteral and renal stones  Procedure: Cystoscopy right ureteroscopy, laser lithotripsy, stone basket extraction, ureteral stent placement  Surgeon: Mena Goes  Anesthesia: General  Findings: On ureteroscopy there is scan fragments in the distal ureter. There was a large fragment in the proximal ureter. There is a large fragment in the right upper pole. All clinically significant fragments were removed. The ureter was inspected in its entirety on withdrawing the ureteroscope and found to be normal without injury or perforation. There was some edema in the proximal ureter where a large fragment had been sitting beside the stent.  Description of procedure: After consent was obtained the patient was brought to the operating room. Timeout was performed to confirm the patient procedure. After adequate anesthesia he was placed in lithotomy position and prepped and draped in the usual sterile fashion. A cystoscope was passed per urethra and the right ureteral stent was grasped and removed through the meatus after the bladder was drained. A sensor wire was advanced up the stent and coiled in the right upper pole. A rigid ureteroscope was passed adjacent to the wire in 3 or 4 stone fragments were found in the distal ureter and these were removed sequentially with the [employer]. The ureter was then inspected out to the junction of the mid to proximal ureter. No other stones were found. A second wire was passed and over this wire a ureteral access sheath was passed. The digital flexible ureteroscope was then passed through the sheath and I encountered a large fragment in the proximal ureter. Using a 200  laser fiber it was fragmented to small pieces and the pieces were removed with the [employer]. The ureter was inspected and noted to be stone free up into the kidney. On renoscopy the upper middle and lower pole was  visualized and a stone fragment was noted in the upper pole. It was too large to pull out and it was fragmented with a 200  laser fiber. Using a Nitinol basket all the fragments were removed. One fragment held up in the ureter. It was released and laser lithotripsy was used to break it in half. The fragments were removed with the [facility] basket. One fragment remained in the right upper pole and it was removed with the Nitinol 0 tip basket. There was a small 0.5-1 mm fragment but I could not get into the basket but that was the only fragment remaining in the kidney. The upper middle and lower poles were inspected and a third time and noted to have no other stone fragments. The access sheath was backed out on the ureteroscope. The ureter was inspected in its entirety and noted to be stone free and without injury or perforation. The remaining wire was backloaded on the cystoscope and a 6 x 26 cm ureteral stent was advanced by fluoroscopic and cystoscopic guidance. A good coil was seen in the kidney in the bladder. The bladder was drained and the cystoscope removed. A string was left on the stent.   Complications: none  EBL: minimal  Drains: 6x26 cm right ureteral stent with string  Specimens - stone fragments to office lab  Disposition: Pt stable to PACU.

## 2011-12-31 NOTE — Progress Notes (Signed)
Stent string taped to penis 

## 2011-12-31 NOTE — Progress Notes (Signed)
Stent string remains taped securely to penis after pt up to BR and voided 2x tea colored urine

## 2011-12-31 NOTE — Anesthesia Preprocedure Evaluation (Signed)
Anesthesia Evaluation  Patient identified by MRN, date of birth, ID band Patient awake    Reviewed: Allergy & Precautions, H&P , NPO status , Patient's Chart, lab work & pertinent test results, reviewed documented beta blocker date and time   Airway Mallampati: III TM Distance: >3 FB Neck ROM: full    Dental  (+) Caps and Dental Advisory Given,    Pulmonary neg pulmonary ROS,  breath sounds clear to auscultation  Pulmonary exam normal       Cardiovascular Exercise Tolerance: Good hypertension, Pt. on home beta blockers and Pt. on medications + CAD, + Past MI and + Cardiac Stents negative cardio ROS  + dysrhythmias Ventricular Fibrillation Rhythm:regular Rate:Normal  MI x 3. 2/12 and 8/12. Stents placed.   Neuro/Psych negative neurological ROS  negative psych ROS   GI/Hepatic negative GI ROS, Neg liver ROS, GERD-  Medicated and Controlled,  Endo/Other  negative endocrine ROS  Renal/GU negative Renal ROS  negative genitourinary   Musculoskeletal   Abdominal   Peds  Hematology negative hematology ROS (+)   Anesthesia Other Findings   Reproductive/Obstetrics negative OB ROS                           Anesthesia Physical  Anesthesia Plan  ASA: III  Anesthesia Plan: General   Post-op Pain Management:    Induction: Intravenous  Airway Management Planned: LMA  Additional Equipment:   Intra-op Plan:   Post-operative Plan:   Informed Consent: I have reviewed the patients History and Physical, chart, labs and discussed the procedure including the risks, benefits and alternatives for the proposed anesthesia with the patient or authorized representative who has indicated his/her understanding and acceptance.   Dental Advisory Given and Dental advisory given  Plan Discussed with: Surgeon and CRNA  Anesthesia Plan Comments:         Anesthesia Quick Evaluation

## 2012-03-09 ENCOUNTER — Ambulatory Visit (INDEPENDENT_AMBULATORY_CARE_PROVIDER_SITE_OTHER): Payer: Medicare Other | Admitting: Cardiovascular Disease

## 2012-03-09 ENCOUNTER — Encounter: Payer: Self-pay | Admitting: Cardiovascular Disease

## 2012-03-09 VITALS — BP 172/98 | HR 48 | Resp 16 | Ht 66.0 in | Wt 191.0 lb

## 2012-03-09 DIAGNOSIS — R079 Chest pain, unspecified: Secondary | ICD-10-CM

## 2012-03-09 DIAGNOSIS — R001 Bradycardia, unspecified: Secondary | ICD-10-CM

## 2012-03-09 DIAGNOSIS — I498 Other specified cardiac arrhythmias: Secondary | ICD-10-CM

## 2012-03-09 DIAGNOSIS — I25810 Atherosclerosis of coronary artery bypass graft(s) without angina pectoris: Secondary | ICD-10-CM

## 2012-03-09 MED ORDER — LOSARTAN POTASSIUM-HCTZ 50-12.5 MG PO TABS: 1.0000 | ORAL_TABLET | Freq: Every day | ORAL | Status: DC
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
## 2012-03-09 NOTE — Patient Instructions (Addendum)
Your physician has requested that you have a lexiscan myoview. Please follow instruction sheet, as given.  Your physician has requested that you have an echocardiogram. Echocardiography is a painless test that uses sound waves to create images of your heart. It provides your doctor with information about the size and shape of your heart and how well your heart's chambers and valves are working. This procedure takes approximately one hour. There are no restrictions for this procedure.  Your physician has recommended you make the following change in your medication:  Start losartan with HCTZ 50mg / 12.5 mg daily in morning  Your physician wants you to follow-up in: 6 months  You will receive a reminder letter in the mail two months in advance. If you don't receive a letter, please call our office to schedule the follow-up appointment.

## 2012-03-11 ENCOUNTER — Encounter: Payer: Medicare Other | Admitting: Cardiovascular Disease

## 2012-03-13 ENCOUNTER — Ambulatory Visit (HOSPITAL_COMMUNITY): Payer: Medicare Other | Attending: Cardiovascular Disease | Admitting: Radiology

## 2012-03-13 DIAGNOSIS — R001 Bradycardia, unspecified: Secondary | ICD-10-CM

## 2012-03-13 DIAGNOSIS — R0602 Shortness of breath: Secondary | ICD-10-CM

## 2012-03-13 DIAGNOSIS — I498 Other specified cardiac arrhythmias: Secondary | ICD-10-CM | POA: Insufficient documentation

## 2012-03-13 DIAGNOSIS — I25810 Atherosclerosis of coronary artery bypass graft(s) without angina pectoris: Secondary | ICD-10-CM

## 2012-03-13 DIAGNOSIS — R079 Chest pain, unspecified: Secondary | ICD-10-CM

## 2012-03-13 DIAGNOSIS — I10 Essential (primary) hypertension: Secondary | ICD-10-CM | POA: Insufficient documentation

## 2012-03-13 DIAGNOSIS — I2510 Atherosclerotic heart disease of native coronary artery without angina pectoris: Secondary | ICD-10-CM | POA: Insufficient documentation

## 2012-03-13 DIAGNOSIS — R0609 Other forms of dyspnea: Secondary | ICD-10-CM | POA: Insufficient documentation

## 2012-03-13 DIAGNOSIS — R0989 Other specified symptoms and signs involving the circulatory and respiratory systems: Secondary | ICD-10-CM | POA: Insufficient documentation

## 2012-03-13 NOTE — Progress Notes (Signed)
Echocardiogram performed.  

## 2012-03-17 ENCOUNTER — Ambulatory Visit (HOSPITAL_COMMUNITY): Payer: Medicare Other | Attending: Cardiovascular Disease | Admitting: Radiology

## 2012-03-17 VITALS — BP 162/86 | HR 51 | Ht 66.0 in | Wt 186.0 lb

## 2012-03-17 DIAGNOSIS — R001 Bradycardia, unspecified: Secondary | ICD-10-CM

## 2012-03-17 DIAGNOSIS — I10 Essential (primary) hypertension: Secondary | ICD-10-CM | POA: Insufficient documentation

## 2012-03-17 DIAGNOSIS — I25810 Atherosclerosis of coronary artery bypass graft(s) without angina pectoris: Secondary | ICD-10-CM

## 2012-03-17 DIAGNOSIS — R0602 Shortness of breath: Secondary | ICD-10-CM | POA: Insufficient documentation

## 2012-03-17 DIAGNOSIS — R079 Chest pain, unspecified: Secondary | ICD-10-CM

## 2012-03-17 DIAGNOSIS — I2510 Atherosclerotic heart disease of native coronary artery without angina pectoris: Secondary | ICD-10-CM

## 2012-03-17 DIAGNOSIS — R42 Dizziness and giddiness: Secondary | ICD-10-CM | POA: Insufficient documentation

## 2012-03-17 DIAGNOSIS — Z8249 Family history of ischemic heart disease and other diseases of the circulatory system: Secondary | ICD-10-CM | POA: Insufficient documentation

## 2012-03-17 MED ORDER — TECHNETIUM TC 99M SESTAMIBI GENERIC - CARDIOLITE
30.0000 | Freq: Once | INTRAVENOUS | Status: AC | PRN
  Administered 2012-03-17: 30 via INTRAVENOUS

## 2012-03-17 MED ORDER — REGADENOSON 0.4 MG/5ML IV SOLN
0.4000 mg | Freq: Once | INTRAVENOUS | Status: AC
  Administered 2012-03-17: 0.4 mg via INTRAVENOUS

## 2012-03-17 MED ORDER — TECHNETIUM TC 99M SESTAMIBI GENERIC - CARDIOLITE
10.0000 | Freq: Once | INTRAVENOUS | Status: AC | PRN
  Administered 2012-03-17: 10 via INTRAVENOUS

## 2012-03-17 NOTE — Progress Notes (Signed)
[facility] SITE 3 NUCLEAR MED [id number] [place] [address] [phone]  Cardiology Nuclear Med Study  [name] is a 66 y.o. male     MRN : [id number]     DOB: 1946-04-14  Procedure Date: 03/17/2012  Nuclear Med Background Indication for Stress Test:  Evaluation for Ischemia and PTCA/Stent Patency  History:  Multiple MI's; 8/12 Stent-LAD, EF=50%; 03/13/12 Echo:EF=60% Cardiac Risk Factors: Family History - CAD, History of Smoking, Hypertension, Lipids and Overweight  Symptoms:  Chest Pain/Pressure. (last episode of chest discomfort was about a month ago),  Light-Headedness, DOE/SOB and Weakness   Nuclear Pre-Procedure Caffeine/Decaff Intake:  None NPO After: 9:00pm   Lungs:  Coarse, but clear. O2 Sat: 97% on room air. IV 0.9% NS with Angio Cath:  22g  IV Site: R Hand  IV Started by:  Stanton Kidney, EMT-P  Chest Size (in):  42 Cup Size: n/a  Height: 5\' 6"  (1.676 m)  Weight:  186 lb (84.369 kg)  BMI:  Body mass index is 30.02 kg/(m^2). Tech Comments:  Hyzaar held this am, Coreg taken, per patient.    Nuclear Med Study 1 or 2 day study: 1 day  Stress Test Type:  Eugenie Birks  Reading MD: Charlton Haws, MD  Order Authorizing Provider:  Tonny Bollman, MD  Resting Radionuclide: Technetium 47m Sestamibi  Resting Radionuclide Dose: 10.9 mCi   Stress Radionuclide:  Technetium 71m Sestamibi  Stress Radionuclide Dose: 33.0 mCi           Stress Protocol Rest HR: 51 Stress HR: 80  Rest BP: 162/86 Stress BP: 160/?  Exercise Time (min): n/a METS: n/a   Predicted Max HR: 154 bpm % Max HR: 51.95 bpm Rate Pressure Product: 95188   Dose of Adenosine (mg):  n/a Dose of Lexiscan: 0.4 mg  Dose of Atropine (mg): n/a Dose of Dobutamine: n/a mcg/kg/min (at max HR)  Stress Test Technologist: Smiley Houseman, CMA-N  Nuclear Technologist:  Domenic Polite, CNMT     Rest Procedure:  Myocardial perfusion imaging was performed at rest 45 minutes following  the intravenous administration of Technetium 56m Sestamibi.  Rest ECG: Sinus bradycardia with nonspecific T-wave changes.  Stress Procedure:  The patient received IV Lexiscan 0.4 mg over 15-seconds.  Technetium 47m Sestamibi was injected at 30-seconds.  There were no more diffuse T-wave changes with Lexiscan.  Quantitative spect images were obtained after a 45 minute delay.  Stress ECG: No significant change from baseline ECG  QPS Raw Data Images:  Normal; no motion artifact; normal heart/lung ratio. Stress Images:  There is decreased uptake in the anterior wall. Rest Images:  There is decreased uptake in the anterior wall. Subtraction (SDS):  There is a fixed defect that is most consistent with a previous infarction. Transient Ischemic Dilatation (Normal <1.22):  1.07 Lung/Heart Ratio (Normal <0.45):  0.30  Quantitative Gated Spect Images QGS EDV:  87 ml QGS ESV:  39 ml  Impression Exercise Capacity:  Lexiscan with no exercise. BP Response:  Normal blood pressure response. Clinical Symptoms:  There is dyspnea. ECG Impression:  No significant ST segment change suggestive of ischemia. Comparison with Prior Nuclear Study: No images to compare  Overall Impression:  Low risk stress nuclear study. Small apical infarct with no ischemia  LV Ejection Fraction: 56%.  LV Wall Motion:  NL LV Function; NL Wall Motion    Charlton Haws

## 2012-07-18 ENCOUNTER — Other Ambulatory Visit: Payer: Self-pay

## 2012-09-04 ENCOUNTER — Other Ambulatory Visit: Payer: Self-pay | Admitting: Cardiovascular Disease

## 2012-09-04 NOTE — Telephone Encounter (Signed)
Fax Received. Refill Completed. [name] (R.M.A)   

## 2012-10-13 ENCOUNTER — Encounter: Payer: Self-pay | Admitting: Cardiovascular Disease

## 2012-10-13 ENCOUNTER — Ambulatory Visit (INDEPENDENT_AMBULATORY_CARE_PROVIDER_SITE_OTHER): Payer: Medicare Other | Admitting: Cardiovascular Disease

## 2012-10-13 VITALS — BP 132/82 | HR 55 | Ht 66.0 in | Wt 199.0 lb

## 2012-10-13 DIAGNOSIS — I10 Essential (primary) hypertension: Secondary | ICD-10-CM

## 2012-10-13 DIAGNOSIS — I2510 Atherosclerotic heart disease of native coronary artery without angina pectoris: Secondary | ICD-10-CM

## 2012-10-13 MED ORDER — LOSARTAN POTASSIUM-HCTZ 50-12.5 MG PO TABS: ORAL_TABLET | ORAL | Status: DC

## 2012-10-13 NOTE — Patient Instructions (Addendum)
Your physician wants you to follow-up in: 6 MONTHS with Dr Cooper.  You will receive a reminder letter in the mail two months in advance. If you don't receive a letter, please call our office to schedule the follow-up appointment.  Your physician recommends that you continue on your current medications as directed. Please refer to the Current Medication list given to you today.  
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
## 2013-04-08 ENCOUNTER — Other Ambulatory Visit: Payer: Self-pay

## 2013-05-25 ENCOUNTER — Ambulatory Visit (INDEPENDENT_AMBULATORY_CARE_PROVIDER_SITE_OTHER): Payer: Medicare Other | Admitting: Cardiovascular Disease

## 2013-05-25 ENCOUNTER — Encounter: Payer: Self-pay | Admitting: Cardiovascular Disease

## 2013-05-25 VITALS — BP 136/86 | HR 54 | Ht 66.0 in | Wt 202.0 lb

## 2013-05-25 DIAGNOSIS — I2510 Atherosclerotic heart disease of native coronary artery without angina pectoris: Secondary | ICD-10-CM
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
## 2013-05-25 NOTE — Patient Instructions (Signed)
Your physician recommends that you continue on your current medications as directed. Please refer to the Current Medication list given to you today.  Your physician wants you to follow-up in: 6 months with Dr. Cooper.  You will receive a reminder letter in the mail two months in advance. If you don't receive a letter, please call our office to schedule the follow-up appointment.  

## 2013-07-08 ENCOUNTER — Other Ambulatory Visit: Payer: Self-pay | Admitting: *Deleted

## 2013-07-08 DIAGNOSIS — I2510 Atherosclerotic heart disease of native coronary artery without angina pectoris: Secondary | ICD-10-CM

## 2013-07-08 DIAGNOSIS — I10 Essential (primary) hypertension: Secondary | ICD-10-CM

## 2013-07-08 MED ORDER — LOSARTAN POTASSIUM-HCTZ 50-12.5 MG PO TABS: ORAL_TABLET | ORAL | Status: DC

## 2013-09-29 IMAGING — CR DG ABDOMEN 1V
2 series · 2 of 2 positions shown · non-contrast
Comparison: Plain film 11/17/2011, CT 07/30/2011

CLINICAL DATA: Pre lithotripsy

ABDOMEN - 1 VIEW

[t abdomen supine (1 of 2)]
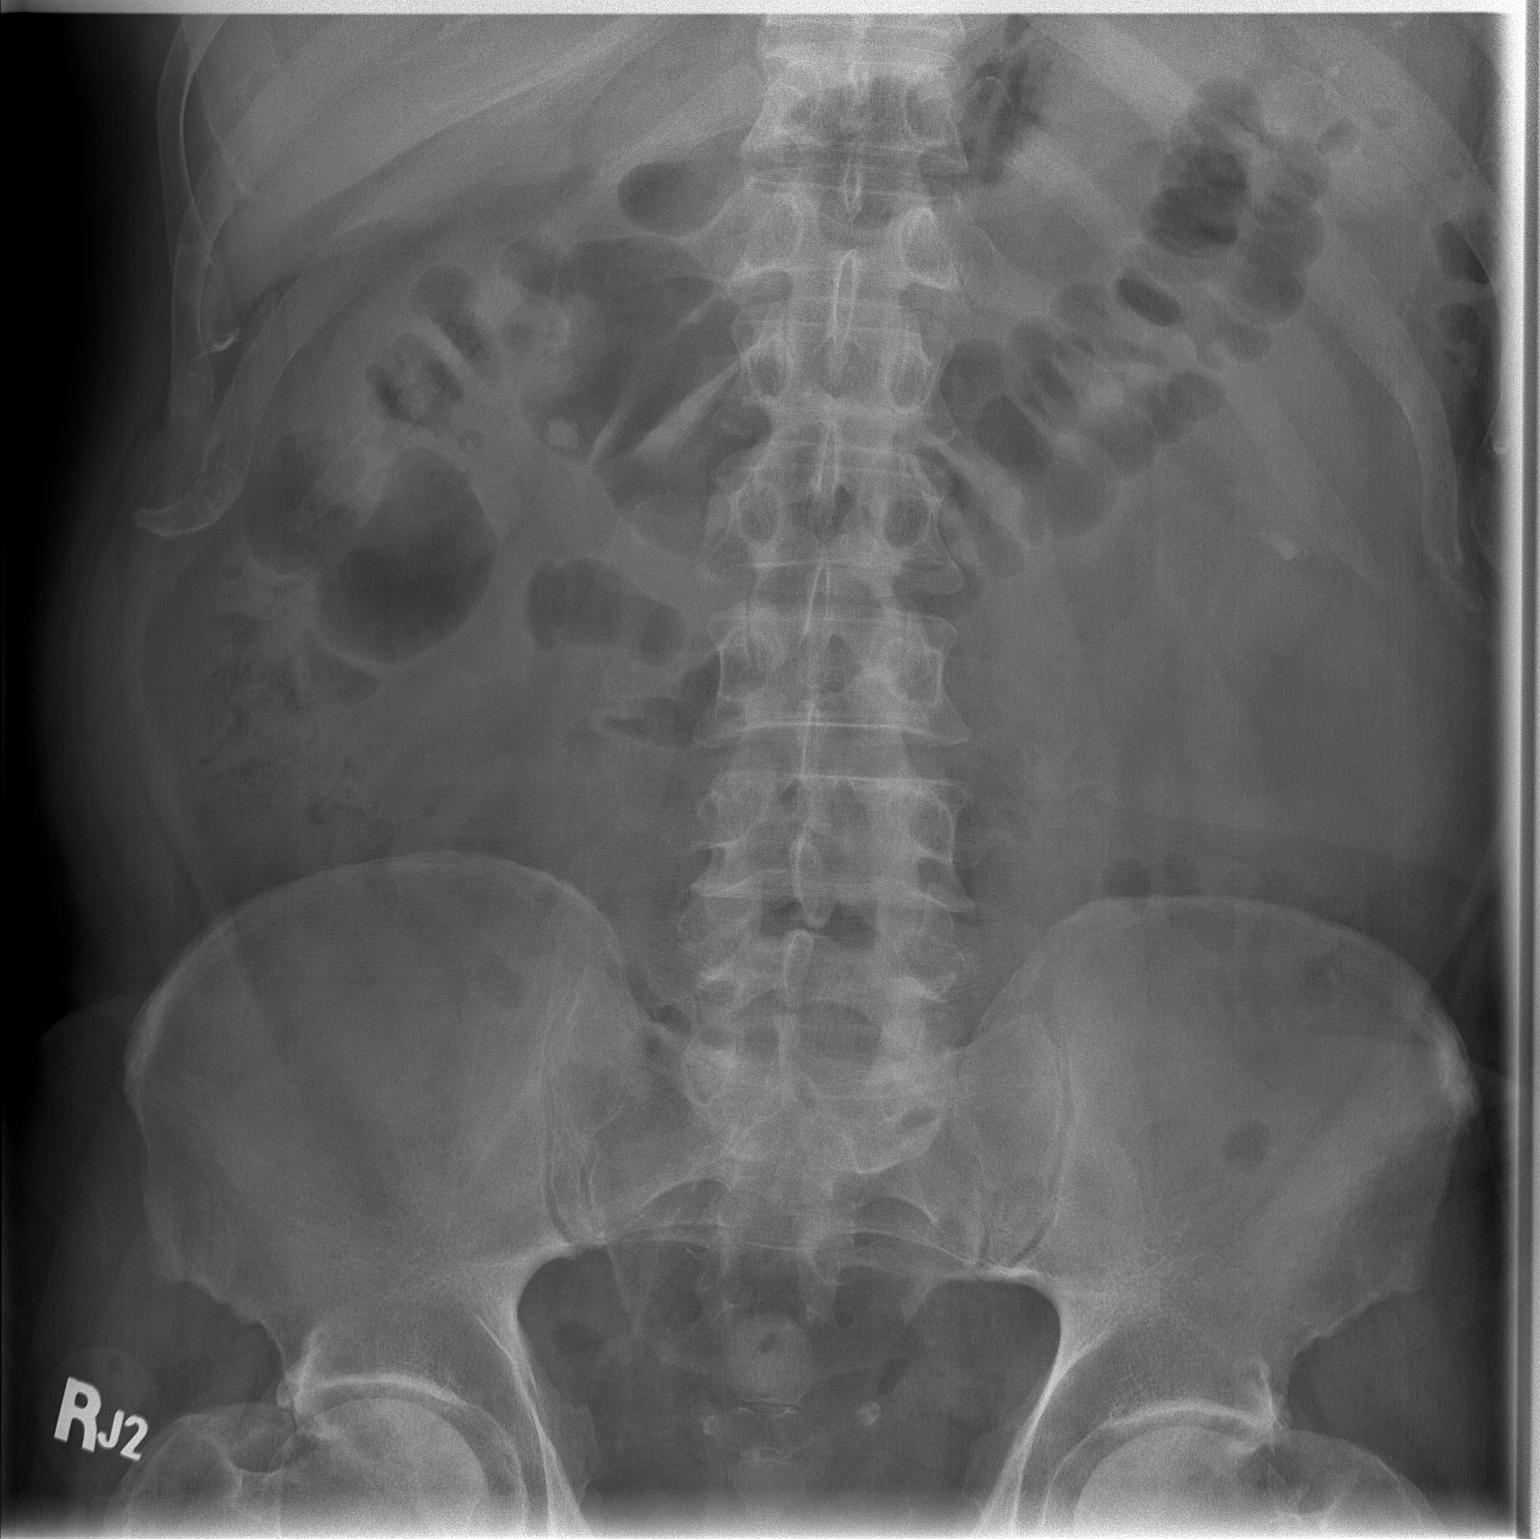

[t abdomen supine (2 of 2)]
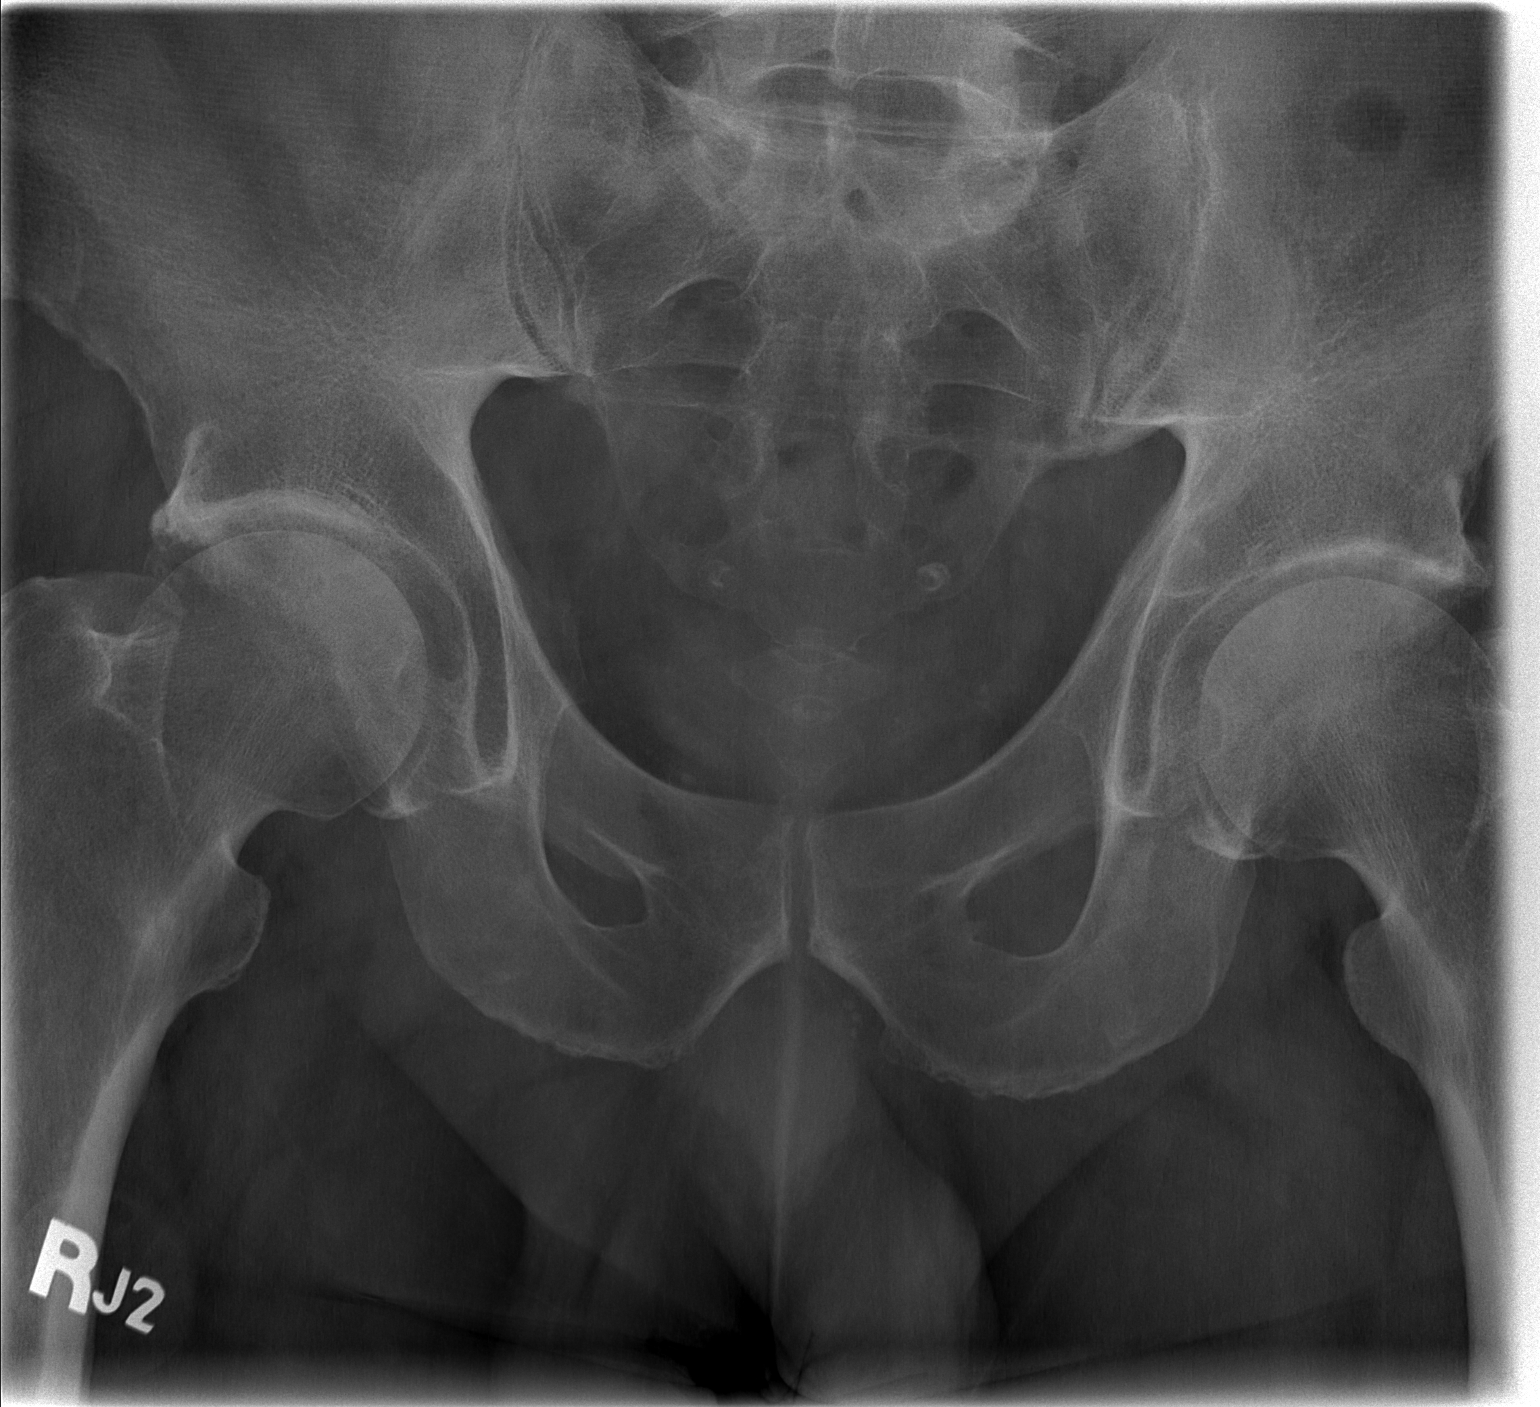

[2 of 2 positions shown; findings below may reference images not displayed]

FINDINGS: There is a 10 mm calculus in the mid right renal
collecting system.  There is 11 mm calculus in the lower pole of
the left kidney.  No ureteral lithiasis evident.
IMPRESSION: Bilateral nephrolithiasis.

## 2013-11-18 ENCOUNTER — Ambulatory Visit (INDEPENDENT_AMBULATORY_CARE_PROVIDER_SITE_OTHER): Payer: Medicare Other | Admitting: Cardiovascular Disease

## 2013-11-18 ENCOUNTER — Encounter: Payer: Self-pay | Admitting: Cardiovascular Disease

## 2013-11-18 VITALS — BP 132/80 | Ht 66.0 in | Wt 196.8 lb

## 2013-11-18 DIAGNOSIS — E785 Hyperlipidemia, unspecified: Secondary | ICD-10-CM

## 2013-11-18 DIAGNOSIS — I2510 Atherosclerotic heart disease of native coronary artery without angina pectoris: Secondary | ICD-10-CM

## 2013-11-18 DIAGNOSIS — I10 Essential (primary) hypertension: Secondary | ICD-10-CM

## 2013-11-18 MED ORDER — NITROGLYCERIN 0.4 MG SL SUBL: 0.4000 mg | SUBLINGUAL_TABLET | SUBLINGUAL | Status: DC | PRN

## 2013-11-18 MED ORDER — LOSARTAN POTASSIUM-HCTZ 50-12.5 MG PO TABS: ORAL_TABLET | ORAL | Status: DC
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
## 2013-11-18 NOTE — Patient Instructions (Signed)
Your physician wants you to follow-up in: 6 MONTHS with Dr Cooper.  You will receive a reminder letter in the mail two months in advance. If you don't receive a letter, please call our office to schedule the follow-up appointment.  Your physician recommends that you continue on your current medications as directed. Please refer to the Current Medication list given to you today.  

## 2014-06-16 ENCOUNTER — Encounter (HOSPITAL_COMMUNITY): Payer: Self-pay | Admitting: Urology

## 2014-09-12 ENCOUNTER — Ambulatory Visit (INDEPENDENT_AMBULATORY_CARE_PROVIDER_SITE_OTHER): Payer: Medicare Other | Admitting: Cardiovascular Disease

## 2014-09-12 ENCOUNTER — Encounter: Payer: Self-pay | Admitting: Cardiovascular Disease

## 2014-09-12 VITALS — BP 152/82 | HR 53 | Ht 67.0 in | Wt 194.4 lb

## 2014-09-12 DIAGNOSIS — I10 Essential (primary) hypertension: Secondary | ICD-10-CM | POA: Diagnosis not present

## 2014-09-12 DIAGNOSIS — I2510 Atherosclerotic heart disease of native coronary artery without angina pectoris: Secondary | ICD-10-CM | POA: Diagnosis not present
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
## 2014-09-12 NOTE — Patient Instructions (Signed)
Your physician wants you to follow-up in: 1 YEAR with Dr Cooper.  You will receive a reminder letter in the mail two months in advance. If you don't receive a letter, please call our office to schedule the follow-up appointment.  Your physician recommends that you continue on your current medications as directed. Please refer to the Current Medication list given to you today.  

## 2015-06-20 ENCOUNTER — Observation Stay (HOSPITAL_COMMUNITY)
Admission: EM | Admit: 2015-06-20 | Discharge: 2015-06-21 | Disposition: A | Discharge status: Home / Self Care | Payer: Medicare Other | Attending: Internal Medicine | Admitting: Internal Medicine

## 2015-06-20 ENCOUNTER — Encounter (HOSPITAL_COMMUNITY): Payer: Self-pay | Admitting: Emergency Medicine

## 2015-06-20 DIAGNOSIS — Z79899 Other long term (current) drug therapy: Secondary | ICD-10-CM | POA: Diagnosis not present

## 2015-06-20 DIAGNOSIS — K219 Gastro-esophageal reflux disease without esophagitis: Secondary | ICD-10-CM | POA: Insufficient documentation

## 2015-06-20 DIAGNOSIS — E785 Hyperlipidemia, unspecified: Secondary | ICD-10-CM | POA: Diagnosis not present

## 2015-06-20 DIAGNOSIS — I10 Essential (primary) hypertension: Secondary | ICD-10-CM | POA: Insufficient documentation

## 2015-06-20 DIAGNOSIS — I2510 Atherosclerotic heart disease of native coronary artery without angina pectoris: Secondary | ICD-10-CM | POA: Diagnosis not present

## 2015-06-20 DIAGNOSIS — Z7902 Long term (current) use of antithrombotics/antiplatelets: Secondary | ICD-10-CM | POA: Insufficient documentation

## 2015-06-20 DIAGNOSIS — Z955 Presence of coronary angioplasty implant and graft: Secondary | ICD-10-CM | POA: Diagnosis not present

## 2015-06-20 DIAGNOSIS — Z7982 Long term (current) use of aspirin: Secondary | ICD-10-CM | POA: Insufficient documentation

## 2015-06-20 DIAGNOSIS — I252 Old myocardial infarction: Secondary | ICD-10-CM | POA: Insufficient documentation

## 2015-06-20 DIAGNOSIS — I959 Hypotension, unspecified: Secondary | ICD-10-CM

## 2015-06-20 DIAGNOSIS — R040 Epistaxis: Principal | ICD-10-CM

## 2015-06-20 DIAGNOSIS — Z87891 Personal history of nicotine dependence: Secondary | ICD-10-CM | POA: Diagnosis not present

## 2015-06-20 LAB — CBC
HCT: 43.7 % (ref 39.0–52.0)
HEMATOCRIT: 39.6 % (ref 39.0–52.0)
HEMATOCRIT: 37.9 % — AB (ref 39.0–52.0)
HEMOGLOBIN: 15 g/dL (ref 13.0–17.0)
HEMOGLOBIN: 12.7 g/dL — AB (ref 13.0–17.0)
Hemoglobin: 13.2 g/dL (ref 13.0–17.0)
MCH: 30.4 pg (ref 26.0–34.0)
MCH: 29.7 pg (ref 26.0–34.0)
MCH: 29.8 pg (ref 26.0–34.0)
MCHC: 34.3 g/dL (ref 30.0–36.0)
MCHC: 33.3 g/dL (ref 30.0–36.0)
MCHC: 33.5 g/dL (ref 30.0–36.0)
MCV: 88.5 fL (ref 78.0–100.0)
MCV: 89 fL (ref 78.0–100.0)
MCV: 89 fL (ref 78.0–100.0)
Platelets: 257 10*3/uL (ref 150–400)
Platelets: 219 10*3/uL (ref 150–400)
Platelets: 239 10*3/uL (ref 150–400)
RBC: 4.94 MIL/uL (ref 4.22–5.81)
RBC: 4.45 MIL/uL (ref 4.22–5.81)
RBC: 4.26 MIL/uL (ref 4.22–5.81)
RDW: 13.5 % (ref 11.5–15.5)
RDW: 13.3 % (ref 11.5–15.5)
RDW: 13.5 % (ref 11.5–15.5)
WBC: 9.2 10*3/uL (ref 4.0–10.5)
WBC: 12.5 10*3/uL — ABNORMAL HIGH (ref 4.0–10.5)
WBC: 11.8 10*3/uL — ABNORMAL HIGH (ref 4.0–10.5)

## 2015-06-20 LAB — TYPE AND SCREEN
ABO/RH(D): O POS
Antibody Screen: NEGATIVE

## 2015-06-20 LAB — BASIC METABOLIC PANEL
Anion gap: 10 (ref 5–15)
BUN: 13 mg/dL (ref 6–20)
CHLORIDE: 109 mmol/L (ref 101–111)
CO2: 22 mmol/L (ref 22–32)
Calcium: 8.9 mg/dL (ref 8.9–10.3)
Creatinine, Ser: 1.08 mg/dL (ref 0.61–1.24)
GFR calc Af Amer: >60 mL/min (ref >60)
GFR calc non Af Amer: >60 mL/min (ref >60)
Glucose, Bld: 150 mg/dL — ABNORMAL HIGH (ref 65–99)
Potassium: 3.7 mmol/L (ref 3.5–5.1)
SODIUM: 141 mmol/L (ref 135–145)

## 2015-06-20 LAB — I-STAT TROPONIN, ED
TROPONIN I, POC: 0 ng/mL (ref 0.00–0.08)
Troponin i, poc: 0.01 ng/mL (ref 0.00–0.08)

## 2015-06-20 LAB — ABO/RH: ABO/RH(D): O POS

## 2015-06-20 MED ORDER — PROMETHAZINE HCL 25 MG/ML IJ SOLN
6.2500 mg | Freq: Once | INTRAMUSCULAR | Status: AC
  Administered 2015-06-20: 6.25 mg via INTRAVENOUS
  Filled 2015-06-20: qty 1

## 2015-06-20 MED ORDER — ACETAMINOPHEN 325 MG PO TABS
650.0000 mg | ORAL_TABLET | Freq: Once | ORAL | Status: AC
  Administered 2015-06-20: 650 mg via ORAL
  Filled 2015-06-20: qty 2

## 2015-06-20 MED ORDER — ZOLPIDEM TARTRATE 5 MG PO TABS
5.0000 mg | ORAL_TABLET | Freq: Every evening | ORAL | Status: DC | PRN
  Administered 2015-06-20: 5 mg via ORAL
  Filled 2015-06-20: qty 1

## 2015-06-20 MED ORDER — DIPHENHYDRAMINE HCL 25 MG PO CAPS: 25.0000 mg | ORAL_CAPSULE | Freq: Every evening | ORAL | Status: DC | PRN

## 2015-06-20 MED ORDER — AMOXICILLIN-POT CLAVULANATE 500-125 MG PO TABS
1.0000 | ORAL_TABLET | Freq: Two times a day (BID) | ORAL | Status: DC
  Administered 2015-06-20 – 2015-06-21 (×2): 500 mg via ORAL
  Filled 2015-06-20 (×3): qty 1

## 2015-06-20 MED ORDER — OXYMETAZOLINE HCL 0.05 % NA SOLN
1.0000 | Freq: Two times a day (BID) | NASAL | Status: DC
  Administered 2015-06-20 (×2): 1 via NASAL

## 2015-06-20 MED ORDER — NITROGLYCERIN 0.4 MG SL SUBL: 0.4000 mg | SUBLINGUAL_TABLET | SUBLINGUAL | Status: DC | PRN

## 2015-06-20 MED ORDER — SODIUM CHLORIDE 0.9 % IV BOLUS (SEPSIS)
1000.0000 mL | Freq: Once | INTRAVENOUS | Status: AC
  Administered 2015-06-20: 1000 mL via INTRAVENOUS

## 2015-06-20 MED ORDER — PANTOPRAZOLE SODIUM 40 MG PO TBEC
40.0000 mg | DELAYED_RELEASE_TABLET | Freq: Every day | ORAL | Status: DC
  Administered 2015-06-20: 40 mg via ORAL
  Filled 2015-06-20: qty 1

## 2015-06-20 MED ORDER — SODIUM CHLORIDE 0.9 % IV SOLN
Freq: Once | INTRAVENOUS | Status: AC
  Administered 2015-06-20: 11:00:00 via INTRAVENOUS

## 2015-06-20 MED ORDER — PRAVASTATIN SODIUM 40 MG PO TABS
40.0000 mg | ORAL_TABLET | Freq: Every day | ORAL | Status: DC
  Administered 2015-06-20: 40 mg via ORAL
  Filled 2015-06-20: qty 1

## 2015-06-20 MED ORDER — SODIUM CHLORIDE 0.9 % IV SOLN
INTRAVENOUS | Status: DC
  Administered 2015-06-20 – 2015-06-21 (×3): via INTRAVENOUS

## 2015-06-20 MED ORDER — ACETAMINOPHEN 325 MG PO TABS
650.0000 mg | ORAL_TABLET | Freq: Four times a day (QID) | ORAL | Status: DC | PRN
  Administered 2015-06-20: 650 mg via ORAL
  Filled 2015-06-20: qty 2

## 2015-06-20 NOTE — ED Provider Notes (Signed)
  Face-to-face evaluation   History: He complains of nose bleeding, which started this morning. No known trauma. No recent change in medications.  Physical exam: Alert, elderly man. Bilateral anterior nasal bleeding, with spitting red blood. No respiratory distress.  Medical screening examination/treatment/procedure(s) were conducted as a shared visit with non-physician practitioner(s) and myself.  I personally evaluated the patient during the encounter  Daleen Bo, MD 06/20/15 [phone]

## 2015-06-20 NOTE — ED Notes (Signed)
From home via EMS, nosebleed 30 min pta with active bleeding on arrival, VSS, no other complaints, 18g LAC, NAD

## 2015-06-20 NOTE — ED Notes (Signed)
[facility] [employer] Meal tray ordered @ [id number]

## 2015-06-20 NOTE — ED Notes (Signed)
Rapid rhino inserted by PA

## 2015-06-20 NOTE — ED Notes (Signed)
Pt with apparent vagal episode, was hypotensive, diaphoretic and altered for approx 3 mins, fluids run, MD and PA to bedside, suction given, labs sent
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
## 2015-06-20 NOTE — Progress Notes (Signed)
Patient admitted to room, no distress or pain at this time. Oriented to room, pm meds given, vital signs stable, no signs of bleeding. Girlfriend at bedside, call light within reach.

## 2015-06-20 NOTE — ED Notes (Signed)
Pt improved, continues to bleed, BP, color and mental status improved

## 2015-06-21 DIAGNOSIS — E785 Hyperlipidemia, unspecified: Secondary | ICD-10-CM

## 2015-06-21 DIAGNOSIS — I2510 Atherosclerotic heart disease of native coronary artery without angina pectoris: Secondary | ICD-10-CM | POA: Diagnosis not present

## 2015-06-21 DIAGNOSIS — R040 Epistaxis: Secondary | ICD-10-CM

## 2015-06-21 DIAGNOSIS — I10 Essential (primary) hypertension: Secondary | ICD-10-CM

## 2015-06-21 LAB — BASIC METABOLIC PANEL
ANION GAP: 11 (ref 5–15)
BUN: 16 mg/dL (ref 6–20)
CALCIUM: 8 mg/dL — AB (ref 8.9–10.3)
CHLORIDE: 112 mmol/L — AB (ref 101–111)
CO2: 20 mmol/L — AB (ref 22–32)
CREATININE: 0.93 mg/dL (ref 0.61–1.24)
GFR calc Af Amer: >60 mL/min (ref >60)
GLUCOSE: 115 mg/dL — AB (ref 65–99)
POTASSIUM: 3.4 mmol/L — AB (ref 3.5–5.1)
Sodium: 143 mmol/L (ref 135–145)

## 2015-06-21 LAB — CBC
HEMATOCRIT: 35.9 % — AB (ref 39.0–52.0)
Hemoglobin: 12 g/dL — ABNORMAL LOW (ref 13.0–17.0)
MCH: 29.9 pg (ref 26.0–34.0)
MCHC: 33.4 g/dL (ref 30.0–36.0)
MCV: 89.3 fL (ref 78.0–100.0)
Platelets: 240 10*3/uL (ref 150–400)
RBC: 4.02 MIL/uL — AB (ref 4.22–5.81)
RDW: 13.6 % (ref 11.5–15.5)
WBC: 14.9 10*3/uL — AB (ref 4.0–10.5)

## 2015-06-21 MED ORDER — TRAMADOL HCL 50 MG PO TABS: 50.0000 mg | ORAL_TABLET | Freq: Three times a day (TID) | ORAL | Status: DC | PRN

## 2015-06-21 MED ORDER — AMOXICILLIN-POT CLAVULANATE 500-125 MG PO TABS: 1.0000 | ORAL_TABLET | Freq: Two times a day (BID) | ORAL | Status: DC

## 2015-06-21 MED ORDER — POTASSIUM CHLORIDE CRYS ER 20 MEQ PO TBCR
20.0000 meq | EXTENDED_RELEASE_TABLET | Freq: Once | ORAL | Status: AC
  Administered 2015-06-21: 20 meq via ORAL
  Filled 2015-06-21: qty 1

## 2015-06-21 MED ORDER — TRAMADOL HCL 50 MG PO TABS
50.0000 mg | ORAL_TABLET | Freq: Three times a day (TID) | ORAL | Status: DC | PRN
Start: 2015-06-21 — End: 2015-06-26

## 2015-06-21 NOTE — Progress Notes (Signed)
D/c teaching complete including reviewed AVS, Rx, ATB has been called to his pharmacy and all questions answered. Volunteer will take patient via w/c to [facility] exit - family member to assist patient get home and obtain Rx's.

## 2015-06-21 NOTE — Discharge Summary (Signed)
Name: [name] MRN: [id number] DOB: Jun 22, 1945 70 y.o. PCP: Dione Housekeeper, MD  Date of Admission: 06/20/2015  9:01 AM Date of Discharge: 06/21/2015 Attending Physician: Aldine Contes, MD  Discharge Diagnosis: 1. Epistaxis 2. CAD 3. HTN 4. HLD  Discharge Medications:   Medication List    STOP taking these medications        clopidogrel 75 MG tablet  Commonly known as:  PLAVIX      TAKE these medications        amoxicillin-clavulanate 500-125 MG tablet  Commonly known as:  AUGMENTIN  Take 1 tablet (500 mg total) by mouth 2 (two) times daily.     aspirin 81 MG chewable tablet  Chew 81 mg by mouth every morning.     carvedilol 12.5 MG tablet  Commonly known as:  COREG  Take 6.25 mg by mouth 2 (two) times daily with a meal.     hydrochlorothiazide 25 MG tablet  Commonly known as:  HYDRODIURIL  Take 25 mg by mouth daily.     losartan 100 MG tablet  Commonly known as:  COZAAR  Take 100 mg by mouth daily.     losartan-hydrochlorothiazide 50-12.5 MG tablet  Commonly known as:  HYZAAR  TAKE ONE TABLET BY MOUTH EVERY DAY     multivitamin with minerals Tabs tablet  Take 1 tablet by mouth every other day.     nitroGLYCERIN 0.4 MG SL tablet  Commonly known as:  NITROSTAT  Place 1 tablet (0.4 mg total) under the tongue every 5 (five) minutes as needed. For chest pain     pantoprazole 40 MG tablet  Commonly known as:  PROTONIX  Take 40 mg by mouth at bedtime.     pravastatin 40 MG tablet  Commonly known as:  PRAVACHOL  Take 40 mg by mouth at bedtime.     traMADol 50 MG tablet  Commonly known as:  ULTRAM  Take 1 tablet (50 mg total) by mouth every 8 (eight) hours as needed for moderate pain or severe pain.        Disposition and follow-up:   Mr.[name] was discharged from [facility] in good condition.  At the hospital follow up visit please address:  1.  The patient's ASA 81 mg was held during hospitalization and restarted on  discharge. His clopidogrel, however, was held. At cardiology appointment, please address dual antiplatelet needs in setting of nosebleed.  2.  Labs / imaging needed at time of follow-up: None  3.  Pending labs/ test needing follow-up: None  Follow-up Appointments:     Follow-up Information    Follow up with Melissa Montane, MD On 06/26/2015.   Specialty:  Otolaryngology   Why:  at 1:20pm to have nasal packing removed   Contact information:   [address] [place] [address] [phone]       Follow up with [facility] On 06/26/2015.   Specialty:  Cardiology   Why:  at 11:45am to discuss restarting plavix   Contact information:   [address], [place] [phone]      Discharge Instructions:   Take augmentin once tonight and then twice a day starting tomorrow until you your visit with [facility] on Monday.   STOP taking plavix until you see [facility] on Monday at 11:45am.   If your nose bleeds again with the nasal packing go to the emergency  department.    Admission HPI: [name] is a 70 year old with a past medical history of CAD (MI 2012, s/p 1x DES and 1x BMS, on clopidogrel and 81 mg aspirin), HTN, HLD, GERD, and Transfusion once during 2012 MI who presents with a nosebleed. The episode started at 7 am this morning while he was brushing his teeth shortly after he woke up. He attempted to back it with gauze and pinch his nose, but it was ineffective. The episode lasted until that time until 11 am, when he had arrived at the ED and a rapid rhino was inserted. Associated symptoms have included right sided constant headache, nausea, sore throat from the bleeding, and light-headedness upon standing. He does report sneezing more than usual over the past 2-3 days. He occasionally uses Flonase, but none recently. He has not been picking his nose. He has a brother who had a similar bleeding  episode in the past once, and it lasted for 1.5 days. He has no family history of bleeding disorders. He denies any insufflation of illicit drugs. He has a remote history of being stationed in [place] for 18 months. He drinks socially. He has started no new medications. He denies any blackouts, chest pain, cough dyspnea, vomiting, diarrhea, dark stools, blood in stool, hemoptysis, unintended weight loss, fever, chills, or night sweats. In the ED, The patient hypotensive (SPB to 60s), bradycardic (50s) and diaphoretic with active bleeding out of the nasal passages. This improved with Afrin spray. Initial Hgb was 15. He had another bleeding episode, which resolved with Rapid Rhino Rocket and his Hgb decreased to 13.2. He was given 2L boluses of NS and his hypotension improved.   Hospital Course by problem list: Active Problems:   Epistaxis   Epistaxis: We spoke with ENT physician, Dr. Wilburn Cornelia, who recommended Augmentin and to keep the [facility] in place until a follow-up appointment with an ENT, which was made for January 23. By day of discharge, his Hgb had stabilized ~12. He also had reassuring orthostatic vital signs after receiving fluid resuscitation. He had no additional bleeding episodes. He complained of a right sided headache, with tenderness to palpation of right maxillary and frontal sinuses, for which Tramadol 50 mg q8h was prescribed to good effect. He was discharged with 15 pills of tramadol and Augmentin 500-125 BID to finish by his ENT visit.   CAD (MI 2012, s/p 1x DES and 1x BMS): Dual antiplatelet therapy (clopidogrel 75 mg daily and ASA 81 mg) was held in setting of an acute bleed. He was discharged on ASA 81 mg daily only with follow-up with his cardiologist on January 23  HTN: Held home HCTZ, Hyzaar in setting of hypotension on admission, which was resumed on discharge  HLD: Continued on home Pravastatin  Discharge Vitals:   BP 158/71 mmHg  Pulse 65  Temp(Src) 97.5 F (36.4  C) (Oral)  Resp 17  Ht 5\' 6"  (1.676 m)  Wt 202 lb (91.627 kg)  BMI 32.62 kg/m2  SpO2 99%  Discharge Labs:  Results for orders placed or performed during the hospital encounter of 06/20/15 (from the past 24 hour(s))  I-Stat Troponin, ED - 0, 3, 6 hours (not at [facility])     Status: None   Collection Time: 06/20/15  3:37 PM  Result Value Ref Range   Troponin i, poc 0.00 0.00 - 0.08 ng/mL   Comment 3          CBC     Status: Abnormal  Collection Time: 06/20/15  6:25 PM  Result Value Ref Range   WBC 11.8 (H) 4.0 - 10.5 K/uL   RBC 4.26 4.22 - 5.81 MIL/uL   Hemoglobin 12.7 (L) 13.0 - 17.0 g/dL   HCT 37.9 (L) 39.0 - 52.0 %   MCV 89.0 78.0 - 100.0 fL   MCH 29.8 26.0 - 34.0 pg   MCHC 33.5 30.0 - 36.0 g/dL   RDW 13.5 11.5 - 15.5 %   Platelets 239 150 - 400 K/uL  I-Stat Troponin, ED - 0, 3, 6 hours (not at [facility])     Status: None   Collection Time: 06/20/15  6:25 PM  Result Value Ref Range   Troponin i, poc 0.01 0.00 - 0.08 ng/mL   Comment 3          CBC     Status: Abnormal   Collection Time: 06/21/15  7:45 AM  Result Value Ref Range   WBC 14.9 (H) 4.0 - 10.5 K/uL   RBC 4.02 (L) 4.22 - 5.81 MIL/uL   Hemoglobin 12.0 (L) 13.0 - 17.0 g/dL   HCT 35.9 (L) 39.0 - 52.0 %   MCV 89.3 78.0 - 100.0 fL   MCH 29.9 26.0 - 34.0 pg   MCHC 33.4 30.0 - 36.0 g/dL   RDW 13.6 11.5 - 15.5 %   Platelets 240 150 - 400 K/uL  Basic metabolic panel     Status: Abnormal   Collection Time: 06/21/15  7:45 AM  Result Value Ref Range   Sodium 143 135 - 145 mmol/L   Potassium 3.4 (L) 3.5 - 5.1 mmol/L   Chloride 112 (H) 101 - 111 mmol/L   CO2 20 (L) 22 - 32 mmol/L   Glucose, Bld 115 (H) 65 - 99 mg/dL   BUN 16 6 - 20 mg/dL   Creatinine, Ser 0.93 0.61 - 1.24 mg/dL   Calcium 8.0 (L) 8.9 - 10.3 mg/dL   GFR calc non Af Amer >60 >60 mL/min   GFR calc Af Amer >60 >60 mL/min   Anion gap 11 5 - 15    Signed: Liberty Handy, MD 06/21/2015, 1:17 PM

## 2015-06-21 NOTE — Progress Notes (Signed)
Patient has been stable throughout the night. Has been able to ambulate to the bathroom without experiencing dizziness. He also has had no further episodes of bleeding. Call light within reach.

## 2015-06-21 NOTE — Discharge Instructions (Signed)
Take augmentin once tonight and then twice a day starting tomorrow until you your visit with [facility] on Monday.   STOP taking plavix until you see [facility] on Monday at 11:45am.   If your nose bleeds again with the nasal packing go to the emergency department.

## 2015-06-21 NOTE — Care Management Obs Status (Signed)
[facility] NOTIFICATION   Patient Details  Name: DAION CIARDULLO MRN: [id number] Date of Birth: March 19, 1946   Medicare Observation Status Notification Given:  No (<24 hours)    Maryclare Labrador, RN 06/21/2015, 12:02 PM

## 2015-06-21 NOTE — Progress Notes (Signed)
Patient seen and examined. Case d/w residents in detail.   HPI: 70 y/o male with PMH of CAD s/p MI , stents *2 (1 BMS, 1 DES), HTN, HLD who p/w epistaxis. Patient states he was brushing his teeth yesterday morning and began bleeding from hios nose. He was unable to stop the bleed at home and came to the ED for further management, He does complain of increased sneezing in the morning over the last few days and complained of assoc lightheadedness on standing, R sided HA and nausea. No vomiting, no syncope, no CP, no SOB, no abd pain, no hemoptysis, no melena, no hematochezia, no hematemesis, no fevers/chills. Initially epistaxis resolved with Afrin but then recurred and he had a Rhino Rocket placed which stopped the bleeding. He complains of pain over the right side of his head and over his right maxillary sinus and right nostril currently.  Physical Exam: Gen: AAoO*3, NAD CVS: RRR, normal heart sounds Lungs: CTA b/l Abd: soft, non tender, BS + Ext: no edema HEENT: rhino rocket in place in right nostril, mild TTP over R maxillary and frontal sinuses  Assessment and Plan: 70 y/o male with epistaxis likely secondary to being on dual antiplatelet therapy. No further bleeding. Orthostatics has resolved. Hg only slightly decreased today. Case d/w ENT- outpatient f/u for rhino rocket removal and evaluation of etiology of epistaxis. Will c/w pain control. Will d/c IVF and monitor. C/w augmentin while rhino rocket in place. Will continue to hold asa and clopidogrel for now, Can resume asa in AM if no further bleeding. Will need to d/w PCP and cardio as to timing/need to resume clopidogrel. Patient stable for d/c home today

## 2015-06-21 NOTE — Progress Notes (Signed)
   Subjective:  Mr. Dize complained of a right sided headache this morning. He denied any additional bleeding episodes. He said he did not feel light-headed upon standing.   Objective: Vital signs in last 24 hours: Filed Vitals:   06/20/15 1230 06/20/15 2015 06/20/15 2040 06/21/15 0559  BP: 110/68 142/72 153/73 158/71  Pulse: 70 69 66 65  Temp:  99.9 F (37.7 C) 97.7 F (36.5 C) 97.5 F (36.4 C)  TempSrc:  Oral Oral Oral  Resp: 16 16 16 17   Height:      Weight:      SpO2: 98% 97% 100% 99%   Weight change:   Intake/Output Summary (Last 24 hours) at 06/21/15 1009 Last data filed at 06/21/15 0604  Gross per 24 hour  Intake      0 ml  Output    700 ml  Net   -700 ml   Physical Exam General: lying in bed, NAD HEENT: Mild right eye droop, EOMI. Pupils equal. TTP frontal and maxillary sinuses on right. Some old blood in back of mouth, otherwise clear, moist oropharynx. Cardiovascular: RRR, no m/r/g Pulmonary: CTAB. Unlabored breathing. Abdominal: Soft, NT/ND Extremities: No clubbing, cyanosis, or edema. Extremities warm Neurological: Tongue midline. Face symmetric. 5/5 strength bilaterally.  Lab Results: Basic Metabolic Panel:  Recent Labs Lab 06/20/15 0920 06/21/15 0745  NA 141 143  K 3.7 3.4*  CL 109 112*  CO2 22 20*  GLUCOSE 150* 115*  BUN 13 16  CREATININE 1.08 0.93  CALCIUM 8.9 8.0*   CBC:  Recent Labs Lab 06/20/15 1825 06/21/15 0745  WBC 11.8* 14.9*  HGB 12.7* 12.0*  HCT 37.9* 35.9*  MCV 89.0 89.3  PLT 239 240   Medications: I have reviewed the patient's current medications. Scheduled Meds: . amoxicillin-clavulanate  1 tablet Oral BID  . pantoprazole  40 mg Oral QHS  . pravastatin  40 mg Oral QHS   Continuous Infusions: . sodium chloride 150 mL/hr at 06/21/15 0351   PRN Meds:.acetaminophen, traMADol, zolpidem Assessment/Plan:  Epistaxis: No bleeding episodes overnight. No orthostatic hypotension. Will discharge on augmentin for 5 days  until his ENT appointment for removal of Rhino Rocket. Hgb stable at 12. - Continue Rhino Rocket until ENT follow-up - Augmentin 500-125 BID while Rocket in place - 150 cc/hr NS - Tramadol 50 mg q8h prn - Outpatient f/u  CAD (MI 2012, s/p 1x DES and 1x BMS): Diaphoresis likely related to bleeding episode, denies any chest pain. In setting of bleed, will hold dual antiplatelet therapy for now. - Restart ASA 81 mg daily on discharge - Hold clopidogrel 75 mg daily until cardiology follow-up  HTN: Restart home HCTZ, Hyzaar discharge  HLD: Continue Pravastatin  DVT Prophylaxis: SCDs  Dispo: Discharge home today.  The patient does have a current PCP Dione Housekeeper, MD) and does not need an [facility] hospital follow-up appointment after discharge.  The patient does not have transportation limitations that hinder transportation to clinic appointments.  .Services Needed at time of discharge: Y = Yes, Blank = No PT:   OT:   RN:   Equipment:   Other:       Liberty Handy, MD 06/21/2015, 10:09 AM

## 2015-06-26 ENCOUNTER — Encounter: Payer: Self-pay | Admitting: Physician Assistant

## 2015-06-26 ENCOUNTER — Ambulatory Visit (INDEPENDENT_AMBULATORY_CARE_PROVIDER_SITE_OTHER): Payer: Medicare Other | Admitting: Physician Assistant

## 2015-06-26 VITALS — BP 122/64 | HR 50 | Ht 66.0 in | Wt 195.1 lb

## 2015-06-26 DIAGNOSIS — I2510 Atherosclerotic heart disease of native coronary artery without angina pectoris: Secondary | ICD-10-CM | POA: Diagnosis not present

## 2015-06-26 NOTE — Assessment & Plan Note (Signed)
Blood pressure was low in the setting of nose bleed but has come up and is stable now.

## 2015-06-26 NOTE — Assessment & Plan Note (Signed)
Stable with no cardiac complaints. Will discuss with Dr. Burt Knack need for Plavix and aspirin in light of significant nosebleed. He is going to ENT to have his nose unpacked today and they should give their opinion as far as long-term anticoagulation as well.
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
## 2015-06-26 NOTE — Patient Instructions (Signed)
Medication Instructions:  Your physician recommends that you continue on your current medications as directed. Please refer to the Current Medication list given to you today.    Labwork: None   Testing/Procedures: None   Follow-Up: Your physician recommends that you schedule a follow-up appointment in: 2 months with Dr Burt Knack.       If you need a refill on your cardiac medications before your next appointment, please call your pharmacy.

## 2015-07-03 NOTE — Progress Notes (Signed)
Dr. Burt Knack said he can stay on Aspirin 81 mg daily alone because of recent nose bleed.

## 2015-07-04 ENCOUNTER — Telehealth: Payer: Self-pay | Admitting: *Deleted

## 2015-07-04 NOTE — Telephone Encounter (Signed)
-----   Message from Imogene Burn, [place] sent at 07/03/2015  8:08 AM EST -----   ----- Message -----    From: [name], MD    Sent: 07/02/2015   9:57 PM      To: Imogene Burn, PA-C  I think ok to just keep on ASA 81 mg at this point. thx Selinda Eon ----- Message -----    From: Imogene Burn, PA-C    Sent: 06/26/2015   1:11 PM      To: [name], MD  Dr. Burt Knack, Can you review this patient who you wanted on DAPT but just hospitalized with significant nose bleed? Thanks, Ermalinda Barrios

## 2015-07-04 NOTE — Telephone Encounter (Signed)
Per Ermalinda Barrios, PA-C, called pt re: pt wanted on DAPT.  Per Dr. Burt Knack, he recommends the pt to stay on Asa 81 mg daily.  Pt has been informed of this and verbalized understanding.  jw 07/04/15

## 2015-09-04 ENCOUNTER — Encounter: Payer: Self-pay | Admitting: Cardiovascular Disease

## 2015-09-04 ENCOUNTER — Ambulatory Visit (INDEPENDENT_AMBULATORY_CARE_PROVIDER_SITE_OTHER): Payer: Medicare Other | Admitting: Cardiovascular Disease

## 2015-09-04 VITALS — BP 140/80 | HR 48 | Ht 66.0 in | Wt 200.0 lb

## 2015-09-04 DIAGNOSIS — I2510 Atherosclerotic heart disease of native coronary artery without angina pectoris: Secondary | ICD-10-CM
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
## 2015-09-04 NOTE — Patient Instructions (Signed)

## 2016-03-02 ENCOUNTER — Ambulatory Visit (INDEPENDENT_AMBULATORY_CARE_PROVIDER_SITE_OTHER): Payer: Medicare Other

## 2016-03-02 DIAGNOSIS — Z23 Encounter for immunization: Secondary | ICD-10-CM | POA: Diagnosis not present

## 2016-03-19 ENCOUNTER — Ambulatory Visit (INDEPENDENT_AMBULATORY_CARE_PROVIDER_SITE_OTHER): Payer: Medicare Other | Admitting: Cardiovascular Disease

## 2016-03-19 ENCOUNTER — Encounter: Payer: Self-pay | Admitting: Cardiovascular Disease

## 2016-03-19 VITALS — BP 152/80 | HR 54 | Ht 66.0 in | Wt 202.0 lb

## 2016-03-19 DIAGNOSIS — I10 Essential (primary) hypertension: Secondary | ICD-10-CM

## 2016-03-19 DIAGNOSIS — I2510 Atherosclerotic heart disease of native coronary artery without angina pectoris: Secondary | ICD-10-CM

## 2016-03-19 NOTE — Patient Instructions (Signed)
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
## 2016-09-17 ENCOUNTER — Encounter: Payer: Self-pay | Admitting: Cardiovascular Disease

## 2016-09-26 ENCOUNTER — Ambulatory Visit (INDEPENDENT_AMBULATORY_CARE_PROVIDER_SITE_OTHER): Payer: Medicare Other | Admitting: Cardiovascular Disease

## 2016-09-26 ENCOUNTER — Encounter: Payer: Self-pay | Admitting: Cardiovascular Disease

## 2016-09-26 VITALS — BP 160/82 | HR 56 | Ht 67.0 in | Wt 202.8 lb

## 2016-09-26 DIAGNOSIS — I2510 Atherosclerotic heart disease of native coronary artery without angina pectoris: Secondary | ICD-10-CM | POA: Diagnosis not present

## 2016-09-26 DIAGNOSIS — I10 Essential (primary) hypertension: Secondary | ICD-10-CM | POA: Diagnosis not present
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
## 2016-09-26 NOTE — Patient Instructions (Signed)

## 2016-10-04 ENCOUNTER — Ambulatory Visit: Payer: Medicare Other | Admitting: Cardiovascular Disease

## 2016-12-01 ENCOUNTER — Emergency Department (HOSPITAL_COMMUNITY)
Admission: EM | Admit: 2016-12-01 | Discharge: 2016-12-01 | Disposition: A | Discharge status: Home / Self Care | Payer: Medicare Other | Attending: Emergency Medicine | Admitting: Emergency Medicine

## 2016-12-01 ENCOUNTER — Emergency Department (HOSPITAL_COMMUNITY): Payer: Medicare Other

## 2016-12-01 ENCOUNTER — Encounter (HOSPITAL_COMMUNITY): Payer: Self-pay | Admitting: *Deleted

## 2016-12-01 DIAGNOSIS — I10 Essential (primary) hypertension: Secondary | ICD-10-CM | POA: Diagnosis not present

## 2016-12-01 DIAGNOSIS — R079 Chest pain, unspecified: Secondary | ICD-10-CM | POA: Diagnosis not present

## 2016-12-01 DIAGNOSIS — Z87891 Personal history of nicotine dependence: Secondary | ICD-10-CM | POA: Insufficient documentation

## 2016-12-01 DIAGNOSIS — I252 Old myocardial infarction: Secondary | ICD-10-CM | POA: Diagnosis not present

## 2016-12-01 DIAGNOSIS — D649 Anemia, unspecified: Secondary | ICD-10-CM | POA: Diagnosis not present

## 2016-12-01 DIAGNOSIS — R072 Precordial pain: Secondary | ICD-10-CM | POA: Insufficient documentation

## 2016-12-01 DIAGNOSIS — I2510 Atherosclerotic heart disease of native coronary artery without angina pectoris: Secondary | ICD-10-CM | POA: Insufficient documentation

## 2016-12-01 LAB — CBC
HCT: 44.3 % (ref 39.0–52.0)
HEMOGLOBIN: 15.5 g/dL (ref 13.0–17.0)
MCH: 30.5 pg (ref 26.0–34.0)
MCHC: 35 g/dL (ref 30.0–36.0)
MCV: 87.2 fL (ref 78.0–100.0)
Platelets: 241 10*3/uL (ref 150–400)
RBC: 5.08 MIL/uL (ref 4.22–5.81)
RDW: 13.4 % (ref 11.5–15.5)
WBC: 8.3 10*3/uL (ref 4.0–10.5)

## 2016-12-01 LAB — BASIC METABOLIC PANEL
ANION GAP: 9 (ref 5–15)
BUN: 13 mg/dL (ref 6–20)
CALCIUM: 9.3 mg/dL (ref 8.9–10.3)
CO2: 24 mmol/L (ref 22–32)
Chloride: 106 mmol/L (ref 101–111)
Creatinine, Ser: 1.21 mg/dL (ref 0.61–1.24)
GFR, EST NON AFRICAN AMERICAN: 58 mL/min — AB (ref >60)
Glucose, Bld: 102 mg/dL — ABNORMAL HIGH (ref 65–99)
POTASSIUM: 3.7 mmol/L (ref 3.5–5.1)
Sodium: 139 mmol/L (ref 135–145)

## 2016-12-01 LAB — I-STAT TROPONIN, ED
TROPONIN I, POC: 0.01 ng/mL (ref 0.00–0.08)
Troponin i, poc: 0 ng/mL (ref 0.00–0.08)

## 2016-12-01 LAB — D-DIMER, QUANTITATIVE: D-Dimer, Quant: 0.3 ug/mL-FEU (ref 0.00–0.50)

## 2016-12-01 MED ORDER — TRAMADOL HCL 50 MG PO TABS: 50.0000 mg | ORAL_TABLET | Freq: Four times a day (QID) | ORAL | 0 refills | Status: DC | PRN

## 2016-12-01 MED ORDER — NITROGLYCERIN 0.4 MG SL SUBL
0.4000 mg | SUBLINGUAL_TABLET | SUBLINGUAL | Status: DC | PRN
  Administered 2016-12-01 (×3): 0.4 mg via SUBLINGUAL
  Filled 2016-12-01: qty 1

## 2016-12-01 MED ORDER — ASPIRIN 81 MG PO CHEW
324.0000 mg | CHEWABLE_TABLET | Freq: Once | ORAL | Status: AC
  Administered 2016-12-01: 324 mg via ORAL
  Filled 2016-12-01: qty 4

## 2016-12-01 NOTE — ED Notes (Signed)
Patient transported to X-ray 

## 2016-12-01 NOTE — ED Triage Notes (Signed)
Pt reports left side chest discomfort for several days. Pain increases with palpation and reports "having a knot" on his back.

## 2016-12-01 NOTE — Discharge Instructions (Signed)
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
## 2016-12-01 NOTE — ED Notes (Signed)
Pt verbalized understanding discharge instructions and denies any further needs or questions at this time. VS stable, ambulatory and steady gait.   

## 2016-12-02 ENCOUNTER — Telehealth: Payer: Self-pay | Admitting: Cardiovascular Disease

## 2016-12-02 NOTE — Telephone Encounter (Signed)
New message    Pt is calling asking for a call back from RN. He is calling about him going to the ER. Please call.

## 2016-12-02 NOTE — Telephone Encounter (Signed)
I spoke with the pt and he would like to schedule a post hospital appointment with Dr Burt Knack.  Appointment scheduled on 12/12/16.

## 2016-12-12 ENCOUNTER — Ambulatory Visit (INDEPENDENT_AMBULATORY_CARE_PROVIDER_SITE_OTHER): Payer: Medicare Other | Admitting: Cardiovascular Disease

## 2016-12-12 ENCOUNTER — Encounter: Payer: Self-pay | Admitting: Cardiovascular Disease

## 2016-12-12 VITALS — BP 116/70 | HR 54 | Ht 66.0 in | Wt 199.1 lb

## 2016-12-12 DIAGNOSIS — R0602 Shortness of breath: Secondary | ICD-10-CM

## 2016-12-12 DIAGNOSIS — I25119 Atherosclerotic heart disease of native coronary artery with unspecified angina pectoris: Secondary | ICD-10-CM
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
## 2016-12-12 NOTE — Patient Instructions (Signed)
Medication Instructions:  Your physician recommends that you continue on your current medications as directed. Please refer to the Current Medication list given to you today.  Labwork: No new orders.   Testing/Procedures: Your physician has requested that you have an echocardiogram. Echocardiography is a painless test that uses sound waves to create images of your heart. It provides your doctor with information about the size and shape of your heart and how well your heart's chambers and valves are working. This procedure takes approximately one hour. There are no restrictions for this procedure.  Your physician has requested that you have an exercise stress myoview. For further information please visit [email or web address]. Please follow instruction sheet, as given.  Follow-Up: Your physician wants you to follow-up in: 6 MONTHS with Dr Burt Knack. You will receive a reminder letter in the mail two months in advance. If you don't receive a letter, please call our office to schedule the follow-up appointment.   Any Other Special Instructions Will Be Listed Below (If Applicable).     If you need a refill on your cardiac medications before your next appointment, please call your pharmacy.

## 2016-12-16 ENCOUNTER — Telehealth (HOSPITAL_COMMUNITY): Payer: Self-pay | Admitting: *Deleted

## 2016-12-16 NOTE — Telephone Encounter (Signed)
Patient given detailed instructions per Myocardial Perfusion Study Information Sheet for the test on 12/19/16 at 0715. Patient notified to arrive 15 minutes early and that it is imperative to arrive on time for appointment to keep from having the test rescheduled.  If you need to cancel or reschedule your appointment, please call the office within 24 hours of your appointment. . Patient verbalized understanding.[name], [name]

## 2016-12-19 ENCOUNTER — Ambulatory Visit (HOSPITAL_BASED_OUTPATIENT_CLINIC_OR_DEPARTMENT_OTHER): Payer: Medicare Other

## 2016-12-19 ENCOUNTER — Other Ambulatory Visit: Payer: Self-pay

## 2016-12-19 ENCOUNTER — Ambulatory Visit (HOSPITAL_COMMUNITY): Payer: Medicare Other | Attending: Cardiology

## 2016-12-19 DIAGNOSIS — R079 Chest pain, unspecified: Secondary | ICD-10-CM | POA: Diagnosis not present

## 2016-12-19 DIAGNOSIS — I25119 Atherosclerotic heart disease of native coronary artery with unspecified angina pectoris: Secondary | ICD-10-CM | POA: Diagnosis not present

## 2016-12-19 DIAGNOSIS — R0602 Shortness of breath: Secondary | ICD-10-CM

## 2016-12-19 DIAGNOSIS — R9439 Abnormal result of other cardiovascular function study: Secondary | ICD-10-CM | POA: Insufficient documentation

## 2016-12-19 DIAGNOSIS — D649 Anemia, unspecified: Secondary | ICD-10-CM | POA: Diagnosis not present

## 2016-12-19 DIAGNOSIS — I10 Essential (primary) hypertension: Secondary | ICD-10-CM | POA: Diagnosis not present

## 2016-12-19 DIAGNOSIS — E785 Hyperlipidemia, unspecified: Secondary | ICD-10-CM | POA: Insufficient documentation

## 2016-12-19 DIAGNOSIS — I252 Old myocardial infarction: Secondary | ICD-10-CM | POA: Insufficient documentation

## 2016-12-19 LAB — MYOCARDIAL PERFUSION IMAGING
CHL CUP NUCLEAR SDS: 1
CHL CUP NUCLEAR SRS: 18
CHL CUP NUCLEAR SSS: 19
CSEPPHR: 78 {beats}/min
LHR: 0.28
LV dias vol: 87 mL (ref 62–150)
LVSYSVOL: 42 mL
Rest HR: 49 {beats}/min
TID: 0.96

## 2016-12-19 MED ORDER — REGADENOSON 0.4 MG/5ML IV SOLN
0.4000 mg | Freq: Once | INTRAVENOUS | Status: AC
  Administered 2016-12-19: 0.4 mg via INTRAVENOUS

## 2016-12-19 MED ORDER — TECHNETIUM TC 99M TETROFOSMIN IV KIT
10.9000 | PACK | Freq: Once | INTRAVENOUS | Status: AC | PRN
  Administered 2016-12-19: 10.9 via INTRAVENOUS
  Filled 2016-12-19: qty 11

## 2016-12-19 MED ORDER — TECHNETIUM TC 99M TETROFOSMIN IV KIT
32.8000 | PACK | Freq: Once | INTRAVENOUS | Status: AC | PRN
Start: 2016-12-19 — End: 2016-12-19
  Administered 2016-12-19: 32.8 via INTRAVENOUS
  Filled 2016-12-19: qty 33

## 2016-12-19 MED ORDER — PERFLUTREN LIPID MICROSPHERE
1.0000 mL | INTRAVENOUS | Status: AC | PRN
  Administered 2016-12-19: 2 mL via INTRAVENOUS

## 2017-08-20 DIAGNOSIS — M9903 Segmental and somatic dysfunction of lumbar region: Secondary | ICD-10-CM | POA: Diagnosis not present

## 2017-08-20 DIAGNOSIS — M9904 Segmental and somatic dysfunction of sacral region: Secondary | ICD-10-CM | POA: Diagnosis not present

## 2017-08-20 DIAGNOSIS — M9905 Segmental and somatic dysfunction of pelvic region: Secondary | ICD-10-CM | POA: Diagnosis not present

## 2017-08-20 DIAGNOSIS — M5432 Sciatica, left side: Secondary | ICD-10-CM | POA: Diagnosis not present

## 2017-08-21 DIAGNOSIS — M9903 Segmental and somatic dysfunction of lumbar region: Secondary | ICD-10-CM | POA: Diagnosis not present

## 2017-08-21 DIAGNOSIS — M5432 Sciatica, left side: Secondary | ICD-10-CM | POA: Diagnosis not present

## 2017-08-21 DIAGNOSIS — M9904 Segmental and somatic dysfunction of sacral region: Secondary | ICD-10-CM | POA: Diagnosis not present

## 2017-08-21 DIAGNOSIS — M9905 Segmental and somatic dysfunction of pelvic region: Secondary | ICD-10-CM | POA: Diagnosis not present

## 2017-08-25 DIAGNOSIS — M9905 Segmental and somatic dysfunction of pelvic region: Secondary | ICD-10-CM | POA: Diagnosis not present

## 2017-08-25 DIAGNOSIS — M9903 Segmental and somatic dysfunction of lumbar region: Secondary | ICD-10-CM | POA: Diagnosis not present

## 2017-08-25 DIAGNOSIS — M5432 Sciatica, left side: Secondary | ICD-10-CM | POA: Diagnosis not present

## 2017-08-25 DIAGNOSIS — M9904 Segmental and somatic dysfunction of sacral region: Secondary | ICD-10-CM | POA: Diagnosis not present

## 2017-08-26 DIAGNOSIS — M5432 Sciatica, left side: Secondary | ICD-10-CM | POA: Diagnosis not present

## 2017-08-26 DIAGNOSIS — M9904 Segmental and somatic dysfunction of sacral region: Secondary | ICD-10-CM | POA: Diagnosis not present

## 2017-08-26 DIAGNOSIS — M9903 Segmental and somatic dysfunction of lumbar region: Secondary | ICD-10-CM | POA: Diagnosis not present

## 2017-08-26 DIAGNOSIS — M9905 Segmental and somatic dysfunction of pelvic region: Secondary | ICD-10-CM | POA: Diagnosis not present

## 2017-08-28 DIAGNOSIS — M9903 Segmental and somatic dysfunction of lumbar region: Secondary | ICD-10-CM | POA: Diagnosis not present

## 2017-08-28 DIAGNOSIS — M9905 Segmental and somatic dysfunction of pelvic region: Secondary | ICD-10-CM | POA: Diagnosis not present

## 2017-08-28 DIAGNOSIS — M9904 Segmental and somatic dysfunction of sacral region: Secondary | ICD-10-CM | POA: Diagnosis not present

## 2017-08-28 DIAGNOSIS — M5432 Sciatica, left side: Secondary | ICD-10-CM | POA: Diagnosis not present

## 2017-09-01 DIAGNOSIS — M9905 Segmental and somatic dysfunction of pelvic region: Secondary | ICD-10-CM | POA: Diagnosis not present

## 2017-09-01 DIAGNOSIS — M9903 Segmental and somatic dysfunction of lumbar region: Secondary | ICD-10-CM | POA: Diagnosis not present

## 2017-09-01 DIAGNOSIS — M5432 Sciatica, left side: Secondary | ICD-10-CM | POA: Diagnosis not present

## 2017-09-01 DIAGNOSIS — M9904 Segmental and somatic dysfunction of sacral region: Secondary | ICD-10-CM | POA: Diagnosis not present

## 2017-09-02 DIAGNOSIS — M5432 Sciatica, left side: Secondary | ICD-10-CM | POA: Diagnosis not present

## 2017-09-02 DIAGNOSIS — M9905 Segmental and somatic dysfunction of pelvic region: Secondary | ICD-10-CM | POA: Diagnosis not present

## 2017-09-02 DIAGNOSIS — M9904 Segmental and somatic dysfunction of sacral region: Secondary | ICD-10-CM | POA: Diagnosis not present

## 2017-09-02 DIAGNOSIS — M9903 Segmental and somatic dysfunction of lumbar region: Secondary | ICD-10-CM | POA: Diagnosis not present

## 2017-09-04 DIAGNOSIS — M9903 Segmental and somatic dysfunction of lumbar region: Secondary | ICD-10-CM | POA: Diagnosis not present

## 2017-09-04 DIAGNOSIS — M9904 Segmental and somatic dysfunction of sacral region: Secondary | ICD-10-CM | POA: Diagnosis not present

## 2017-09-04 DIAGNOSIS — M9905 Segmental and somatic dysfunction of pelvic region: Secondary | ICD-10-CM | POA: Diagnosis not present

## 2017-09-04 DIAGNOSIS — M5432 Sciatica, left side: Secondary | ICD-10-CM | POA: Diagnosis not present

## 2017-09-08 DIAGNOSIS — M5432 Sciatica, left side: Secondary | ICD-10-CM | POA: Diagnosis not present

## 2017-09-08 DIAGNOSIS — M9905 Segmental and somatic dysfunction of pelvic region: Secondary | ICD-10-CM | POA: Diagnosis not present

## 2017-09-08 DIAGNOSIS — M9903 Segmental and somatic dysfunction of lumbar region: Secondary | ICD-10-CM | POA: Diagnosis not present

## 2017-09-08 DIAGNOSIS — M9904 Segmental and somatic dysfunction of sacral region: Secondary | ICD-10-CM | POA: Diagnosis not present

## 2017-09-10 DIAGNOSIS — M9903 Segmental and somatic dysfunction of lumbar region: Secondary | ICD-10-CM | POA: Diagnosis not present

## 2017-09-10 DIAGNOSIS — M9904 Segmental and somatic dysfunction of sacral region: Secondary | ICD-10-CM | POA: Diagnosis not present

## 2017-09-10 DIAGNOSIS — M9905 Segmental and somatic dysfunction of pelvic region: Secondary | ICD-10-CM | POA: Diagnosis not present

## 2017-09-10 DIAGNOSIS — M5432 Sciatica, left side: Secondary | ICD-10-CM | POA: Diagnosis not present

## 2017-09-15 DIAGNOSIS — M9903 Segmental and somatic dysfunction of lumbar region: Secondary | ICD-10-CM | POA: Diagnosis not present

## 2017-09-15 DIAGNOSIS — M5432 Sciatica, left side: Secondary | ICD-10-CM | POA: Diagnosis not present

## 2017-09-15 DIAGNOSIS — M9904 Segmental and somatic dysfunction of sacral region: Secondary | ICD-10-CM | POA: Diagnosis not present

## 2017-09-15 DIAGNOSIS — M9905 Segmental and somatic dysfunction of pelvic region: Secondary | ICD-10-CM | POA: Diagnosis not present

## 2017-09-17 DIAGNOSIS — M9903 Segmental and somatic dysfunction of lumbar region: Secondary | ICD-10-CM | POA: Diagnosis not present

## 2017-09-17 DIAGNOSIS — M9905 Segmental and somatic dysfunction of pelvic region: Secondary | ICD-10-CM | POA: Diagnosis not present

## 2017-09-17 DIAGNOSIS — M5432 Sciatica, left side: Secondary | ICD-10-CM | POA: Diagnosis not present

## 2017-09-17 DIAGNOSIS — M9904 Segmental and somatic dysfunction of sacral region: Secondary | ICD-10-CM | POA: Diagnosis not present

## 2017-09-18 DIAGNOSIS — M9904 Segmental and somatic dysfunction of sacral region: Secondary | ICD-10-CM | POA: Diagnosis not present

## 2017-09-18 DIAGNOSIS — M9905 Segmental and somatic dysfunction of pelvic region: Secondary | ICD-10-CM | POA: Diagnosis not present

## 2017-09-18 DIAGNOSIS — M5432 Sciatica, left side: Secondary | ICD-10-CM | POA: Diagnosis not present

## 2017-09-18 DIAGNOSIS — M9903 Segmental and somatic dysfunction of lumbar region: Secondary | ICD-10-CM | POA: Diagnosis not present

## 2017-09-22 DIAGNOSIS — M9903 Segmental and somatic dysfunction of lumbar region: Secondary | ICD-10-CM | POA: Diagnosis not present

## 2017-09-22 DIAGNOSIS — M5432 Sciatica, left side: Secondary | ICD-10-CM | POA: Diagnosis not present

## 2017-09-22 DIAGNOSIS — M9904 Segmental and somatic dysfunction of sacral region: Secondary | ICD-10-CM | POA: Diagnosis not present

## 2017-09-22 DIAGNOSIS — M9905 Segmental and somatic dysfunction of pelvic region: Secondary | ICD-10-CM | POA: Diagnosis not present

## 2017-09-24 DIAGNOSIS — M9904 Segmental and somatic dysfunction of sacral region: Secondary | ICD-10-CM | POA: Diagnosis not present

## 2017-09-24 DIAGNOSIS — M5432 Sciatica, left side: Secondary | ICD-10-CM | POA: Diagnosis not present

## 2017-09-24 DIAGNOSIS — M9905 Segmental and somatic dysfunction of pelvic region: Secondary | ICD-10-CM | POA: Diagnosis not present

## 2017-09-24 DIAGNOSIS — M9903 Segmental and somatic dysfunction of lumbar region: Secondary | ICD-10-CM | POA: Diagnosis not present

## 2017-09-25 DIAGNOSIS — M9904 Segmental and somatic dysfunction of sacral region: Secondary | ICD-10-CM | POA: Diagnosis not present

## 2017-09-25 DIAGNOSIS — M9903 Segmental and somatic dysfunction of lumbar region: Secondary | ICD-10-CM | POA: Diagnosis not present

## 2017-09-25 DIAGNOSIS — M9905 Segmental and somatic dysfunction of pelvic region: Secondary | ICD-10-CM | POA: Diagnosis not present

## 2017-09-25 DIAGNOSIS — M5432 Sciatica, left side: Secondary | ICD-10-CM | POA: Diagnosis not present

## 2017-09-29 DIAGNOSIS — M5432 Sciatica, left side: Secondary | ICD-10-CM | POA: Diagnosis not present

## 2017-09-29 DIAGNOSIS — M9904 Segmental and somatic dysfunction of sacral region: Secondary | ICD-10-CM | POA: Diagnosis not present

## 2017-09-29 DIAGNOSIS — M9905 Segmental and somatic dysfunction of pelvic region: Secondary | ICD-10-CM | POA: Diagnosis not present

## 2017-09-29 DIAGNOSIS — M9903 Segmental and somatic dysfunction of lumbar region: Secondary | ICD-10-CM | POA: Diagnosis not present

## 2017-10-01 DIAGNOSIS — M9905 Segmental and somatic dysfunction of pelvic region: Secondary | ICD-10-CM | POA: Diagnosis not present

## 2017-10-01 DIAGNOSIS — M9904 Segmental and somatic dysfunction of sacral region: Secondary | ICD-10-CM | POA: Diagnosis not present

## 2017-10-01 DIAGNOSIS — M5432 Sciatica, left side: Secondary | ICD-10-CM | POA: Diagnosis not present

## 2017-10-01 DIAGNOSIS — M9903 Segmental and somatic dysfunction of lumbar region: Secondary | ICD-10-CM | POA: Diagnosis not present

## 2018-02-19 ENCOUNTER — Encounter: Payer: Self-pay | Admitting: Cardiovascular Disease

## 2018-02-19 ENCOUNTER — Ambulatory Visit: Payer: Medicare Other | Admitting: Cardiovascular Disease

## 2018-02-19 VITALS — BP 126/70 | HR 56 | Ht 66.0 in | Wt 194.8 lb

## 2018-02-19 DIAGNOSIS — E782 Mixed hyperlipidemia: Secondary | ICD-10-CM | POA: Diagnosis not present

## 2018-02-19 DIAGNOSIS — I2510 Atherosclerotic heart disease of native coronary artery without angina pectoris: Secondary | ICD-10-CM | POA: Diagnosis not present
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
## 2018-02-19 NOTE — Patient Instructions (Signed)

## 2018-07-22 DIAGNOSIS — J00 Acute nasopharyngitis [common cold]: Secondary | ICD-10-CM | POA: Diagnosis not present

## 2018-07-22 DIAGNOSIS — J209 Acute bronchitis, unspecified: Secondary | ICD-10-CM | POA: Diagnosis not present
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
## 2019-02-22 ENCOUNTER — Other Ambulatory Visit: Payer: Self-pay

## 2019-02-22 ENCOUNTER — Ambulatory Visit (INDEPENDENT_AMBULATORY_CARE_PROVIDER_SITE_OTHER): Payer: Medicare Other | Admitting: Physician Assistant

## 2019-02-22 ENCOUNTER — Encounter: Payer: Self-pay | Admitting: Physician Assistant

## 2019-02-22 VITALS — BP 140/78 | HR 51 | Ht 66.0 in | Wt 198.4 lb

## 2019-02-22 DIAGNOSIS — I10 Essential (primary) hypertension: Secondary | ICD-10-CM

## 2019-02-22 DIAGNOSIS — I2510 Atherosclerotic heart disease of native coronary artery without angina pectoris: Secondary | ICD-10-CM

## 2019-02-22 DIAGNOSIS — E782 Mixed hyperlipidemia: Secondary | ICD-10-CM

## 2019-02-22 NOTE — Patient Instructions (Signed)
Medication Instructions:   Your physician recommends that you continue on your current medications as directed. Please refer to the Current Medication list given to you today.  If you need a refill on your cardiac medications before your next appointment, please call your pharmacy.   Lab work:  None ordered today  If you have labs (blood work) drawn today and your tests are completely normal, you will receive your results only by: . MyChart Message (if you have MyChart) OR . A paper copy in the mail If you have any lab test that is abnormal or we need to change your treatment, we will call you to review the results.  Testing/Procedures:  None ordered today  Follow-Up: At [facility], you and your health needs are our priority.  As part of our continuing mission to provide you with exceptional heart care, we have created designated Provider Care Teams.  These Care Teams include your primary Cardiologist (physician) and Advanced Practice Providers (APPs -  Physician Assistants and Nurse Practitioners) who all work together to provide you with the care you need, when you need it. You will need a follow up appointment in:  12 months.  Please call our office 2 months in advance to schedule this appointment.  You may see Michael Cooper, MD or one of the following Advanced Practice Providers on your designated Care Team: Scott Weaver, PA-C Vin Bhagat, PA-C . Janine Hammond, NP     

## 2019-09-02 ENCOUNTER — Telehealth: Payer: Self-pay | Admitting: Cardiovascular Disease

## 2019-09-02 ENCOUNTER — Emergency Department (HOSPITAL_COMMUNITY): Payer: No Typology Code available for payment source

## 2019-09-02 ENCOUNTER — Other Ambulatory Visit: Payer: Self-pay

## 2019-09-02 ENCOUNTER — Emergency Department (HOSPITAL_COMMUNITY)
Admission: EM | Admit: 2019-09-02 | Discharge: 2019-09-02 | Disposition: A | Discharge status: Home / Self Care | Payer: No Typology Code available for payment source | Attending: Emergency Medicine | Admitting: Emergency Medicine

## 2019-09-02 DIAGNOSIS — R42 Dizziness and giddiness: Secondary | ICD-10-CM | POA: Diagnosis not present

## 2019-09-02 DIAGNOSIS — Z87891 Personal history of nicotine dependence: Secondary | ICD-10-CM | POA: Insufficient documentation

## 2019-09-02 DIAGNOSIS — I252 Old myocardial infarction: Secondary | ICD-10-CM | POA: Diagnosis not present

## 2019-09-02 DIAGNOSIS — I10 Essential (primary) hypertension: Secondary | ICD-10-CM | POA: Diagnosis not present

## 2019-09-02 DIAGNOSIS — R079 Chest pain, unspecified: Secondary | ICD-10-CM

## 2019-09-02 DIAGNOSIS — R0602 Shortness of breath: Secondary | ICD-10-CM | POA: Diagnosis not present

## 2019-09-02 DIAGNOSIS — I2510 Atherosclerotic heart disease of native coronary artery without angina pectoris: Secondary | ICD-10-CM | POA: Insufficient documentation

## 2019-09-02 DIAGNOSIS — R0789 Other chest pain: Secondary | ICD-10-CM | POA: Diagnosis not present

## 2019-09-02 LAB — CBC
HCT: 47.1 % (ref 39.0–52.0)
Hemoglobin: 15.7 g/dL (ref 13.0–17.0)
MCH: 30.3 pg (ref 26.0–34.0)
MCHC: 33.3 g/dL (ref 30.0–36.0)
MCV: 90.9 fL (ref 80.0–100.0)
Platelets: 232 10*3/uL (ref 150–400)
RBC: 5.18 MIL/uL (ref 4.22–5.81)
RDW: 13.2 % (ref 11.5–15.5)
WBC: 8.7 10*3/uL (ref 4.0–10.5)
nRBC: 0 % (ref 0.0–0.2)

## 2019-09-02 LAB — BASIC METABOLIC PANEL
Anion gap: 12 (ref 5–15)
BUN: 13 mg/dL (ref 8–23)
CO2: 23 mmol/L (ref 22–32)
Calcium: 8.9 mg/dL (ref 8.9–10.3)
Chloride: 106 mmol/L (ref 98–111)
Creatinine, Ser: 1.18 mg/dL (ref 0.61–1.24)
GFR calc Af Amer: >60 mL/min (ref >60)
GFR calc non Af Amer: >60 mL/min (ref >60)
Glucose, Bld: 113 mg/dL — ABNORMAL HIGH (ref 70–99)
Potassium: 3.9 mmol/L (ref 3.5–5.1)
Sodium: 141 mmol/L (ref 135–145)

## 2019-09-02 LAB — TROPONIN I (HIGH SENSITIVITY)
Troponin I (High Sensitivity): 10 ng/L (ref <18)
Troponin I (High Sensitivity): 10 ng/L (ref <18)

## 2019-09-02 MED ORDER — OXYCODONE HCL 5 MG PO TABS
5.0000 mg | ORAL_TABLET | Freq: Once | ORAL | Status: AC
  Administered 2019-09-02: 5 mg via ORAL
  Filled 2019-09-02: qty 1

## 2019-09-02 MED ORDER — SODIUM CHLORIDE 0.9% FLUSH
3.0000 mL | Freq: Once | INTRAVENOUS | Status: AC
  Administered 2019-09-02: 3 mL via INTRAVENOUS

## 2019-09-02 MED ORDER — NITROGLYCERIN 2 % TD OINT
1.0000 [in_us] | TOPICAL_OINTMENT | Freq: Once | TRANSDERMAL | Status: AC
  Administered 2019-09-02: 1 [in_us] via TOPICAL
  Filled 2019-09-02: qty 1

## 2019-09-02 MED ORDER — ALUM & MAG HYDROXIDE-SIMETH 200-200-20 MG/5ML PO SUSP
30.0000 mL | Freq: Once | ORAL | Status: AC
  Administered 2019-09-02: 30 mL via ORAL
  Filled 2019-09-02: qty 30

## 2019-09-02 MED ORDER — ONDANSETRON 4 MG PO TBDP
4.0000 mg | ORAL_TABLET | Freq: Once | ORAL | Status: AC
  Administered 2019-09-02: 4 mg via ORAL
  Filled 2019-09-02: qty 1

## 2019-09-02 MED ORDER — LIDOCAINE VISCOUS HCL 2 % MT SOLN
15.0000 mL | Freq: Once | OROMUCOSAL | Status: AC
  Administered 2019-09-02: 12:00:00 15 mL via ORAL
  Filled 2019-09-02: qty 15

## 2019-09-02 MED ORDER — SUCRALFATE 1 G PO TABS: 1.0000 g | ORAL_TABLET | Freq: Four times a day (QID) | ORAL | 0 refills | Status: DC | PRN

## 2019-09-02 NOTE — Telephone Encounter (Signed)
Pt c/o of Chest Pain: STAT if CP now or developed within 24 hours  1. Are you having CP right now? More of a tightness not pain  2. Are you experiencing any other symptoms (ex. SOB, nausea, vomiting, sweating)? SOB, dizziness last night, no vomiting   3. How long have you been experiencing CP? About a week  4. Is your CP continuous or coming and going? continuous  5. Have you taken Nitroglycerin? Yes  Pt c/o BP issue: STAT if pt c/o blurred vision, one-sided weakness or slurred speech  1. What are your last 5 BP readings?  09-01-19 7:00 pm: 198/99 09-01-19 7:30 pm:  134/77 09-02-19 8:30 am: 184/87  2. Are you having any other symptoms (ex. Dizziness, headache, blurred vision, passed out)?   3. What is your BP issue? Pt is having chest tightness and high BP. He needs to know if he needs to go to the ER ?

## 2019-09-02 NOTE — ED Triage Notes (Addendum)
Pt presents from home for CP and SOB x2 weeks, describes as 7/10, tightness, L chest, worse when lying flat. Last night, BP 200/100, took nitro and lowered Bp but did not help with CP. Denies diaphoresis, leg swelling.   Has not taken nitro today. H/o 3 Mis.  Has had both covid vaccines, in Feb

## 2019-09-02 NOTE — Telephone Encounter (Signed)
I spoke with patient who reports chest tightness for about a week. BP is elevated and he is having shortness of breath.  I advised patient to call 911 for transport to the hospital.

## 2019-09-02 NOTE — ED Notes (Signed)
Pt transported to xray 

## 2019-09-02 NOTE — Telephone Encounter (Signed)
Attempted to connect to [place] covering for CS 2x with no response

## 2019-09-02 NOTE — Discharge Instructions (Addendum)
You were evaluated in the Emergency Department and after careful evaluation, we did not find any emergent condition requiring admission or further testing in the hospital.  Your exam/testing today was overall reassuring.  No evidence of heart damage today.  As discussed, it is very important you follow-up with your cardiologist within the next 1 to 2 weeks.  Your primary care doctor could also provide further recommendations regarding acid reflux.  For now, you can try using the Carafate during the day for discomfort.  Please return to the Emergency Department if you experience any worsening of your condition.  We encourage you to follow up with a primary care provider.  Thank you for allowing [place] to be a part of your care.
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
## 2019-09-02 NOTE — ED Notes (Signed)
Pt and daughter adds that his ulcer has been "acting up worse" for the past [redacted] weeks alongside his CP and that he has been requiring more medication for GI sx. EDP notified, new orders provided

## 2019-09-16 ENCOUNTER — Encounter: Payer: Self-pay | Admitting: Cardiovascular Disease

## 2019-09-16 ENCOUNTER — Ambulatory Visit (INDEPENDENT_AMBULATORY_CARE_PROVIDER_SITE_OTHER): Payer: Medicare Other | Admitting: Cardiovascular Disease

## 2019-09-16 ENCOUNTER — Other Ambulatory Visit: Payer: Self-pay

## 2019-09-16 ENCOUNTER — Ambulatory Visit: Payer: No Typology Code available for payment source | Admitting: Physician Assistant

## 2019-09-16 VITALS — BP 130/90 | HR 57 | Ht 66.0 in | Wt 193.8 lb

## 2019-09-16 DIAGNOSIS — I25119 Atherosclerotic heart disease of native coronary artery with unspecified angina pectoris: Secondary | ICD-10-CM | POA: Diagnosis not present

## 2019-09-16 DIAGNOSIS — I10 Essential (primary) hypertension: Secondary | ICD-10-CM

## 2019-09-16 DIAGNOSIS — E782 Mixed hyperlipidemia: Secondary | ICD-10-CM

## 2019-09-16 MED ORDER — LOSARTAN POTASSIUM 100 MG PO TABS: 100.0000 mg | ORAL_TABLET | Freq: Every day | ORAL | 3 refills | Status: DC

## 2019-09-16 NOTE — Patient Instructions (Signed)
Medication Instructions:  1) INCREASE LOSARTAN to 100 mg daily *If you need a refill on your cardiac medications before your next appointment, please call your pharmacy*  Testing/Procedures: Your provider has requested that you have a lexiscan myoview. For further information please visit [email or web address]. Please follow instruction sheet, as given.  Follow-Up: At [facility], you and your health needs are our priority.  As part of our continuing mission to provide you with exceptional heart care, we have created designated Provider Care Teams.  These Care Teams include your primary Cardiologist (physician) and Advanced Practice Providers (APPs -  Physician Assistants and Nurse Practitioners) who all work together to provide you with the care you need, when you need it. Your next appointment:   12 month(s) The format for your next appointment:   In Person Provider:   You may see [name], MD or one of the following Advanced Practice Providers on your designated Care Team:    Richardson Dopp, PA-C  [place], [place]  Daune Perch, [place]
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
## 2019-09-20 ENCOUNTER — Telehealth (HOSPITAL_COMMUNITY): Payer: Self-pay | Admitting: *Deleted

## 2019-09-20 NOTE — Telephone Encounter (Signed)
Left message on voicemail per DPR in reference to upcoming appointment scheduled on 09/22/19 with detailed instructions given per Myocardial Perfusion Study Information Sheet for the test. LM to arrive 15 minutes early, and that it is imperative to arrive on time for appointment to keep from having the test rescheduled. If you need to cancel or reschedule your appointment, please call the office within 24 hours of your appointment. Failure to do so may result in a cancellation of your appointment, and a $50 no show fee. Phone number given for call back for any questions. Kirstie Peri

## 2019-09-22 ENCOUNTER — Other Ambulatory Visit: Payer: Self-pay

## 2019-09-22 ENCOUNTER — Ambulatory Visit (HOSPITAL_COMMUNITY): Payer: Medicare Other | Attending: Cardiology

## 2019-09-22 DIAGNOSIS — I25119 Atherosclerotic heart disease of native coronary artery with unspecified angina pectoris: Secondary | ICD-10-CM | POA: Insufficient documentation

## 2019-09-22 LAB — MYOCARDIAL PERFUSION IMAGING
LV dias vol: 60 mL (ref 62–150)
LV sys vol: 26 mL
Peak HR: 74 {beats}/min
Rest HR: 53 {beats}/min
SDS: 0
SRS: 11
SSS: 11
TID: 0.94

## 2019-09-22 MED ORDER — TECHNETIUM TC 99M TETROFOSMIN IV KIT
11.0000 | PACK | Freq: Once | INTRAVENOUS | Status: AC | PRN
  Administered 2019-09-22: 10:00:00 11 via INTRAVENOUS
  Filled 2019-09-22: qty 11

## 2019-09-22 MED ORDER — TECHNETIUM TC 99M TETROFOSMIN IV KIT
30.7000 | PACK | Freq: Once | INTRAVENOUS | Status: AC | PRN
  Administered 2019-09-22: 30.7 via INTRAVENOUS
  Filled 2019-09-22: qty 31

## 2019-09-22 MED ORDER — REGADENOSON 0.4 MG/5ML IV SOLN
0.4000 mg | Freq: Once | INTRAVENOUS | Status: AC
  Administered 2019-09-22: 12:00:00 0.4 mg via INTRAVENOUS

## 2019-09-23 ENCOUNTER — Telehealth: Payer: Self-pay | Admitting: Cardiovascular Disease

## 2019-09-23 NOTE — Telephone Encounter (Signed)
Patient returning call for stress test results. 

## 2019-09-23 NOTE — Telephone Encounter (Signed)
The patient states he listened to his message since calling back. Reiterated low risk stress test results. He was grateful for call back.

## 2019-09-26 ENCOUNTER — Encounter (HOSPITAL_COMMUNITY): Payer: Self-pay

## 2019-09-26 ENCOUNTER — Emergency Department (HOSPITAL_COMMUNITY)
Admission: EM | Admit: 2019-09-26 | Discharge: 2019-09-26 | Disposition: A | Discharge status: Home / Self Care | Payer: No Typology Code available for payment source | Attending: Emergency Medicine | Admitting: Emergency Medicine

## 2019-09-26 ENCOUNTER — Other Ambulatory Visit: Payer: Self-pay

## 2019-09-26 ENCOUNTER — Emergency Department (HOSPITAL_COMMUNITY): Payer: No Typology Code available for payment source

## 2019-09-26 DIAGNOSIS — Z7982 Long term (current) use of aspirin: Secondary | ICD-10-CM | POA: Insufficient documentation

## 2019-09-26 DIAGNOSIS — R531 Weakness: Secondary | ICD-10-CM | POA: Diagnosis not present

## 2019-09-26 DIAGNOSIS — R55 Syncope and collapse: Secondary | ICD-10-CM | POA: Insufficient documentation

## 2019-09-26 DIAGNOSIS — Z87891 Personal history of nicotine dependence: Secondary | ICD-10-CM | POA: Diagnosis not present

## 2019-09-26 DIAGNOSIS — Z79899 Other long term (current) drug therapy: Secondary | ICD-10-CM | POA: Diagnosis not present

## 2019-09-26 DIAGNOSIS — I10 Essential (primary) hypertension: Secondary | ICD-10-CM | POA: Insufficient documentation

## 2019-09-26 DIAGNOSIS — E860 Dehydration: Secondary | ICD-10-CM | POA: Diagnosis not present

## 2019-09-26 DIAGNOSIS — I2510 Atherosclerotic heart disease of native coronary artery without angina pectoris: Secondary | ICD-10-CM | POA: Diagnosis not present

## 2019-09-26 DIAGNOSIS — Z743 Need for continuous supervision: Secondary | ICD-10-CM | POA: Diagnosis not present

## 2019-09-26 DIAGNOSIS — R0689 Other abnormalities of breathing: Secondary | ICD-10-CM | POA: Diagnosis not present

## 2019-09-26 HISTORY — DX: Malignant (primary) neoplasm, unspecified: C80.1

## 2019-09-26 LAB — COMPREHENSIVE METABOLIC PANEL
ALT: 21 U/L (ref 0–44)
AST: 18 U/L (ref 15–41)
Albumin: 4.2 g/dL (ref 3.5–5.0)
Alkaline Phosphatase: 53 U/L (ref 38–126)
Anion gap: 11 (ref 5–15)
BUN: 21 mg/dL (ref 8–23)
CO2: 24 mmol/L (ref 22–32)
Calcium: 9.1 mg/dL (ref 8.9–10.3)
Chloride: 102 mmol/L (ref 98–111)
Creatinine, Ser: 1.4 mg/dL — ABNORMAL HIGH (ref 0.61–1.24)
GFR calc Af Amer: 57 mL/min — ABNORMAL LOW (ref >60)
GFR calc non Af Amer: 49 mL/min — ABNORMAL LOW (ref >60)
Glucose, Bld: 124 mg/dL — ABNORMAL HIGH (ref 70–99)
Potassium: 3.6 mmol/L (ref 3.5–5.1)
Sodium: 137 mmol/L (ref 135–145)
Total Bilirubin: 1 mg/dL (ref 0.3–1.2)
Total Protein: 7.4 g/dL (ref 6.5–8.1)

## 2019-09-26 LAB — CBC WITH DIFFERENTIAL/PLATELET
Abs Immature Granulocytes: 0.03 10*3/uL (ref 0.00–0.07)
Basophils Absolute: 0 10*3/uL (ref 0.0–0.1)
Basophils Relative: 0 %
Eosinophils Absolute: 0.1 10*3/uL (ref 0.0–0.5)
Eosinophils Relative: 1 %
HCT: 45.9 % (ref 39.0–52.0)
Hemoglobin: 15.3 g/dL (ref 13.0–17.0)
Immature Granulocytes: 0 %
Lymphocytes Relative: 15 %
Lymphs Abs: 1.7 10*3/uL (ref 0.7–4.0)
MCH: 30.4 pg (ref 26.0–34.0)
MCHC: 33.3 g/dL (ref 30.0–36.0)
MCV: 91.1 fL (ref 80.0–100.0)
Monocytes Absolute: 0.7 10*3/uL (ref 0.1–1.0)
Monocytes Relative: 6 %
Neutro Abs: 8.9 10*3/uL — ABNORMAL HIGH (ref 1.7–7.7)
Neutrophils Relative %: 78 %
Platelets: 245 10*3/uL (ref 150–400)
RBC: 5.04 MIL/uL (ref 4.22–5.81)
RDW: 13.2 % (ref 11.5–15.5)
WBC: 11.5 10*3/uL — ABNORMAL HIGH (ref 4.0–10.5)
nRBC: 0 % (ref 0.0–0.2)

## 2019-09-26 MED ORDER — SODIUM CHLORIDE 0.9 % IV SOLN: INTRAVENOUS | Status: DC

## 2019-09-26 NOTE — ED Triage Notes (Signed)
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  Reports during a funeral service pt had been standing approx 23min and had a syncopal episode.  Reports passed out for approx 25min.  Rescue arrived and pt was conscious but palpated bp was 60.  CBG 131.  Pt was not diaphoretic.  EMS arrived, pt alert and oriented.  BP was 126/100.  Reports 12 lead was unremarkable.  Pt on losartan.  2 weeks ago had a htn emergency and went to [place]..  Reports was instructed to start taking a full pill today.  Pt took it this morning.  Reports pt was not orthostatic when assessed.  Denies any cp or sob.
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
## 2019-09-26 NOTE — Discharge Instructions (Addendum)
As discussed, your evaluation today has been largely reassuring.  But, it is important that you monitor your condition carefully, and do not hesitate to return to the ED if you develop new, or concerning changes in your condition.  Otherwise, please follow-up with your physician for appropriate ongoing care.  We discussed today's episode and your recent change in your medication.  In the interim, please return to your prior dosing regimen.

## 2019-09-27 ENCOUNTER — Telehealth: Payer: Self-pay | Admitting: Cardiovascular Disease

## 2019-09-27 MED ORDER — LOSARTAN POTASSIUM 50 MG PO TABS: 50.0000 mg | ORAL_TABLET | Freq: Every day | ORAL | 3 refills | Status: DC

## 2019-09-27 NOTE — Telephone Encounter (Signed)
Pt c/o medication issue:  1. Name of Medication: losartan (COZAAR) 100 MG tablet  2. How are you currently taking this medication (dosage and times per day)? 1 tablet (100 mg total) by mouth daily.  3. Are you having a reaction (difficulty breathing--STAT)? Yes  4. What is your medication issue? Patient states medication increase to 1 whole tablet (100 MG) has caused low BP and headache.  Pt c/o BP issue: STAT if pt c/o blurred vision, one-sided weakness or slurred speech  1. What are your last 5 BP readings?   09/26/19: BP- 140/83 09/27/19: BP- 100/50  2. Are you having any other symptoms (ex. Dizziness, headache, blurred vision, passed out)? headache  3. What is your BP issue? Patient states yesterday he went to the ED due to low BP. Patient states his pulse was also faint. He assumes the medication increase has caused low BP. Please advise.

## 2019-09-27 NOTE — Telephone Encounter (Signed)
[name] reports he went to the ED yesterday after fainting at work.  Per ED notes:  "Patient presents via EMS after an episode of syncope. History is obtained by the patient himself as well as EMS providers as the patient arrived to our facility. Patient denies current complaints.  He states that he had cardiology evaluation about 1 week ago that was reportedly normal, but does have a history of cardiac disease. Today the patient was working, standing upright, for about 30 minutes when he felt nauseous, lightheaded.  Soon thereafter an episode of syncope, without pain either before or afterwards.  He is reportedly back to normal now. Patient has notable recent history element of change in his medication, doubling of his losartan, with the first dose of that today. EMS reports the patient was awake, alert, afebrile, hemodynamically normal in route, though on arrival his pressure was 68/palp.  I reviewed the rhythm strips, notable for absence of ischemic changes, sinus rhythm, rate 70s"  His losartan was increased to 100 mg at recent visit.  Today, his BP was 130/79 and 121/70. He did not take any losartan today.  He denies dizziness and lightheadedness today. Reiterated to the patient to stay hydrated.   He understands he will be called with Dr. Antionette Char medication recommendations.

## 2019-09-27 NOTE — Telephone Encounter (Signed)
Instructed the patient to decrease losartan back to 50 mg daily and to stay hydrated. He was grateful for assistance.

## 2019-09-27 NOTE — Telephone Encounter (Signed)
Reduce Losartan 50 mg daily. Ok to resume. Stay hydrated. No other changes. thanks

## 2019-11-09 ENCOUNTER — Encounter: Payer: Self-pay | Admitting: Orthopaedic Surgery

## 2019-11-09 ENCOUNTER — Ambulatory Visit (INDEPENDENT_AMBULATORY_CARE_PROVIDER_SITE_OTHER): Payer: No Typology Code available for payment source | Admitting: Orthopaedic Surgery

## 2019-11-09 ENCOUNTER — Other Ambulatory Visit: Payer: Self-pay

## 2019-11-09 VITALS — Ht 66.0 in | Wt 200.0 lb

## 2019-11-09 DIAGNOSIS — S83242A Other tear of medial meniscus, current injury, left knee, initial encounter: Secondary | ICD-10-CM
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
## 2020-03-17 DIAGNOSIS — L237 Allergic contact dermatitis due to plants, except food: Secondary | ICD-10-CM | POA: Diagnosis not present

## 2021-05-07 ENCOUNTER — Encounter: Payer: Self-pay | Admitting: Physician Assistant
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
## 2021-05-08 ENCOUNTER — Other Ambulatory Visit: Payer: Self-pay

## 2021-05-08 ENCOUNTER — Encounter: Payer: Self-pay | Admitting: Physician Assistant

## 2021-05-08 ENCOUNTER — Ambulatory Visit: Payer: Medicare Other | Admitting: Physician Assistant

## 2021-05-08 VITALS — BP 120/70 | HR 56 | Ht 66.0 in | Wt 189.0 lb

## 2021-05-08 DIAGNOSIS — E785 Hyperlipidemia, unspecified: Secondary | ICD-10-CM | POA: Diagnosis not present

## 2021-05-08 DIAGNOSIS — I77810 Thoracic aortic ectasia: Secondary | ICD-10-CM

## 2021-05-08 DIAGNOSIS — Z23 Encounter for immunization: Secondary | ICD-10-CM | POA: Diagnosis not present

## 2021-05-08 DIAGNOSIS — Z87898 Personal history of other specified conditions: Secondary | ICD-10-CM

## 2021-05-08 DIAGNOSIS — I2510 Atherosclerotic heart disease of native coronary artery without angina pectoris: Secondary | ICD-10-CM

## 2021-05-08 DIAGNOSIS — I10 Essential (primary) hypertension: Secondary | ICD-10-CM | POA: Diagnosis not present

## 2021-05-08 NOTE — Patient Instructions (Addendum)
Medication Instructions:  Your physician recommends that you continue on your current medications as directed. Please refer to the Current Medication list given to you today.  *If you need a refill on your cardiac medications before your next appointment, please call your pharmacy*   Lab Work: None ordered  If you have labs (blood work) drawn today and your tests are completely normal, you will receive your results only by: [place] (if you have MyChart) OR A paper copy in the mail If you have any lab test that is abnormal or we need to change your treatment, we will call you to review the results.   Testing/Procedures: Your physician has requested that you have an echocardiogram. Echocardiography is a painless test that uses sound waves to create images of your heart. It provides your doctor with information about the size and shape of your heart and how well your heart's chambers and valves are working. This procedure takes approximately one hour. There are no restrictions for this procedure.    Follow-Up: At [facility], you and your health needs are our priority.  As part of our continuing mission to provide you with exceptional heart care, we have created designated Provider Care Teams.  These Care Teams include your primary Cardiologist (physician) and Advanced Practice Providers (APPs -  Physician Assistants and Nurse Practitioners) who all work together to provide you with the care you need, when you need it.  We recommend signing up for the patient portal called "MyChart".  Sign up information is provided on this After Visit Summary.  MyChart is used to connect with patients for Virtual Visits (Telemedicine).  Patients are able to view lab/test results, encounter notes, upcoming appointments, etc.  Non-urgent messages can be sent to your provider as well.   To learn more about what you can do with MyChart, go to [email or web address].    Your next appointment:   12  month(s)  The format for your next appointment:   In Person  Provider:   [name], MD  or Melina Copa, PA-C         Other Instructions

## 2021-06-01 ENCOUNTER — Emergency Department (HOSPITAL_BASED_OUTPATIENT_CLINIC_OR_DEPARTMENT_OTHER)
Admission: EM | Admit: 2021-06-01 | Discharge: 2021-06-01 | Disposition: A | Discharge status: Home / Self Care | Payer: No Typology Code available for payment source | Attending: Emergency Medicine | Admitting: Emergency Medicine

## 2021-06-01 ENCOUNTER — Other Ambulatory Visit: Payer: Self-pay

## 2021-06-01 ENCOUNTER — Encounter (HOSPITAL_BASED_OUTPATIENT_CLINIC_OR_DEPARTMENT_OTHER): Payer: Self-pay | Admitting: Emergency Medicine

## 2021-06-01 ENCOUNTER — Emergency Department (HOSPITAL_BASED_OUTPATIENT_CLINIC_OR_DEPARTMENT_OTHER): Payer: No Typology Code available for payment source

## 2021-06-01 DIAGNOSIS — R1011 Right upper quadrant pain: Secondary | ICD-10-CM | POA: Diagnosis not present

## 2021-06-01 DIAGNOSIS — R109 Unspecified abdominal pain: Secondary | ICD-10-CM

## 2021-06-01 DIAGNOSIS — Z7982 Long term (current) use of aspirin: Secondary | ICD-10-CM | POA: Insufficient documentation

## 2021-06-01 DIAGNOSIS — K219 Gastro-esophageal reflux disease without esophagitis: Secondary | ICD-10-CM | POA: Diagnosis not present

## 2021-06-01 DIAGNOSIS — I10 Essential (primary) hypertension: Secondary | ICD-10-CM | POA: Insufficient documentation

## 2021-06-01 DIAGNOSIS — Z87891 Personal history of nicotine dependence: Secondary | ICD-10-CM | POA: Insufficient documentation

## 2021-06-01 DIAGNOSIS — Z955 Presence of coronary angioplasty implant and graft: Secondary | ICD-10-CM | POA: Diagnosis not present

## 2021-06-01 DIAGNOSIS — Z79899 Other long term (current) drug therapy: Secondary | ICD-10-CM | POA: Diagnosis not present

## 2021-06-01 DIAGNOSIS — R1031 Right lower quadrant pain: Secondary | ICD-10-CM | POA: Insufficient documentation

## 2021-06-01 DIAGNOSIS — Z85828 Personal history of other malignant neoplasm of skin: Secondary | ICD-10-CM | POA: Diagnosis not present

## 2021-06-01 DIAGNOSIS — I2510 Atherosclerotic heart disease of native coronary artery without angina pectoris: Secondary | ICD-10-CM | POA: Diagnosis not present

## 2021-06-01 LAB — CBC
HCT: 43.7 % (ref 39.0–52.0)
Hemoglobin: 14.9 g/dL (ref 13.0–17.0)
MCH: 30.5 pg (ref 26.0–34.0)
MCHC: 34.1 g/dL (ref 30.0–36.0)
MCV: 89.5 fL (ref 80.0–100.0)
Platelets: 231 10*3/uL (ref 150–400)
RBC: 4.88 MIL/uL (ref 4.22–5.81)
RDW: 12.6 % (ref 11.5–15.5)
WBC: 7.2 10*3/uL (ref 4.0–10.5)
nRBC: 0 % (ref 0.0–0.2)

## 2021-06-01 LAB — COMPREHENSIVE METABOLIC PANEL
ALT: 19 U/L (ref 0–44)
AST: 18 U/L (ref 15–41)
Albumin: 4.1 g/dL (ref 3.5–5.0)
Alkaline Phosphatase: 45 U/L (ref 38–126)
Anion gap: 7 (ref 5–15)
BUN: 18 mg/dL (ref 8–23)
CO2: 30 mmol/L (ref 22–32)
Calcium: 9.1 mg/dL (ref 8.9–10.3)
Chloride: 103 mmol/L (ref 98–111)
Creatinine, Ser: 1.04 mg/dL (ref 0.61–1.24)
GFR, Estimated: >60 mL/min (ref >60)
Glucose, Bld: 112 mg/dL — ABNORMAL HIGH (ref 70–99)
Potassium: 3.9 mmol/L (ref 3.5–5.1)
Sodium: 140 mmol/L (ref 135–145)
Total Bilirubin: 0.8 mg/dL (ref 0.3–1.2)
Total Protein: 7.3 g/dL (ref 6.5–8.1)

## 2021-06-01 LAB — URINALYSIS, ROUTINE W REFLEX MICROSCOPIC
Bilirubin Urine: NEGATIVE
Glucose, UA: NEGATIVE mg/dL
Hgb urine dipstick: NEGATIVE
Ketones, ur: NEGATIVE mg/dL
Leukocytes,Ua: NEGATIVE
Nitrite: NEGATIVE
Protein, ur: NEGATIVE mg/dL
Specific Gravity, Urine: 1.018 (ref 1.005–1.030)
pH: 7 (ref 5.0–8.0)

## 2021-06-01 LAB — LIPASE, BLOOD: Lipase: 40 U/L (ref 11–51)

## 2021-06-01 MED ORDER — IOHEXOL 300 MG/ML  SOLN
100.0000 mL | Freq: Once | INTRAMUSCULAR | Status: AC | PRN
  Administered 2021-06-01: 15:00:00 100 mL via INTRAVENOUS

## 2021-06-01 MED ORDER — HYDROCODONE-ACETAMINOPHEN 5-325 MG PO TABS
1.0000 | ORAL_TABLET | Freq: Once | ORAL | Status: AC
  Administered 2021-06-01: 16:00:00 1 via ORAL
  Filled 2021-06-01: qty 1

## 2021-06-01 NOTE — ED Triage Notes (Signed)
RUQ pain x 1 month. Denies N/V/D

## 2021-06-01 NOTE — Discharge Instructions (Addendum)
Your CT showed possible cyst on your kidney, but there were no concerning findings for your gallbladder or appendix.  You may follow-up with your primary care provider as needed.  Return to the ED if you are experiencing increasing/worsening pain, inability to keep food down, fever, or worsening symptoms.
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
## 2021-06-13 ENCOUNTER — Ambulatory Visit (HOSPITAL_COMMUNITY): Payer: Medicare Other | Attending: Physician Assistant

## 2021-06-13 ENCOUNTER — Other Ambulatory Visit: Payer: Self-pay

## 2021-06-13 DIAGNOSIS — E785 Hyperlipidemia, unspecified: Secondary | ICD-10-CM | POA: Insufficient documentation

## 2021-06-13 DIAGNOSIS — I10 Essential (primary) hypertension: Secondary | ICD-10-CM | POA: Insufficient documentation

## 2021-06-13 DIAGNOSIS — I77810 Thoracic aortic ectasia: Secondary | ICD-10-CM | POA: Diagnosis not present

## 2021-06-13 DIAGNOSIS — I252 Old myocardial infarction: Secondary | ICD-10-CM | POA: Insufficient documentation

## 2021-06-13 DIAGNOSIS — I2510 Atherosclerotic heart disease of native coronary artery without angina pectoris: Secondary | ICD-10-CM | POA: Diagnosis not present

## 2021-06-13 LAB — ECHOCARDIOGRAM COMPLETE
Area-P 1/2: 3.12 cm2
S' Lateral: 3.7 cm

## 2021-07-03 DIAGNOSIS — R202 Paresthesia of skin: Secondary | ICD-10-CM | POA: Diagnosis not present

## 2021-08-01 DIAGNOSIS — B029 Zoster without complications: Secondary | ICD-10-CM | POA: Diagnosis not present

## 2022-03-28 DIAGNOSIS — M79671 Pain in right foot: Secondary | ICD-10-CM | POA: Diagnosis not present

## 2022-04-17 ENCOUNTER — Ambulatory Visit: Payer: Medicare Other | Attending: Cardiovascular Disease | Admitting: Cardiovascular Disease

## 2022-04-17 ENCOUNTER — Encounter: Payer: Self-pay | Admitting: Cardiovascular Disease

## 2022-04-17 VITALS — BP 138/82 | HR 55 | Ht 66.0 in | Wt 179.0 lb

## 2022-04-17 DIAGNOSIS — I77810 Thoracic aortic ectasia: Secondary | ICD-10-CM | POA: Diagnosis not present

## 2022-04-17 DIAGNOSIS — I2510 Atherosclerotic heart disease of native coronary artery without angina pectoris: Secondary | ICD-10-CM

## 2022-04-17 DIAGNOSIS — I10 Essential (primary) hypertension: Secondary | ICD-10-CM | POA: Diagnosis not present

## 2022-04-17 DIAGNOSIS — E785 Hyperlipidemia, unspecified: Secondary | ICD-10-CM
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
## 2022-04-17 NOTE — Patient Instructions (Signed)
Medication Instructions:  Your physician recommends that you continue on your current medications as directed. Please refer to the Current Medication list given to you today.  *If you need a refill on your cardiac medications before your next appointment, please call your pharmacy*   Lab Work: NONE If you have labs (blood work) drawn today and your tests are completely normal, you will receive your results only by: MyChart Message (if you have MyChart) OR A paper copy in the mail If you have any lab test that is abnormal or we need to change your treatment, we will call you to review the results.   Testing/Procedures: NONE   Follow-Up: At [facility], you and your health needs are our priority.  As part of our continuing mission to provide you with exceptional heart care, we have created designated Provider Care Teams.  These Care Teams include your primary Cardiologist (physician) and Advanced Practice Providers (APPs -  Physician Assistants and Nurse Practitioners) who all work together to provide you with the care you need, when you need it.  We recommend signing up for the patient portal called "MyChart".  Sign up information is provided on this After Visit Summary.  MyChart is used to connect with patients for Virtual Visits (Telemedicine).  Patients are able to view lab/test results, encounter notes, upcoming appointments, etc.  Non-urgent messages can be sent to your provider as well.   To learn more about what you can do with MyChart, go to [email or web address].    Your next appointment:   1 year(s)  The format for your next appointment:   In Person  Provider:   Michael Cooper, MD       Important Information About Sugar       

## 2022-09-09 ENCOUNTER — Ambulatory Visit: Payer: No Typology Code available for payment source | Admitting: Cardiovascular Disease

## 2022-10-29 DIAGNOSIS — M79671 Pain in right foot: Secondary | ICD-10-CM | POA: Diagnosis not present

## 2022-10-29 DIAGNOSIS — M778 Other enthesopathies, not elsewhere classified: Secondary | ICD-10-CM | POA: Diagnosis not present

## 2022-11-25 ENCOUNTER — Telehealth: Payer: Self-pay

## 2022-11-25 ENCOUNTER — Ambulatory Visit: Payer: Self-pay | Admitting: Surgery

## 2022-11-25 ENCOUNTER — Telehealth: Payer: Self-pay | Admitting: *Deleted

## 2022-11-25 NOTE — Telephone Encounter (Signed)
  Patient Consent for Virtual Visit        [name] has provided verbal consent on 11/25/2022 for a virtual visit (video or telephone).   CONSENT FOR VIRTUAL VISIT FOR:  [name]  By participating in this virtual visit I agree to the following:  I hereby voluntarily request, consent and authorize [facility] and its employed or contracted physicians, physician assistants, nurse practitioners or other licensed health care professionals (the Practitioner), to provide me with telemedicine health care services (the "Services") as deemed necessary by the treating Practitioner. I acknowledge and consent to receive the Services by the Practitioner via telemedicine. I understand that the telemedicine visit will involve communicating with the Practitioner through live audiovisual communication technology and the disclosure of certain medical information by electronic transmission. I acknowledge that I have been given the opportunity to request an in-person assessment or other available alternative prior to the telemedicine visit and am voluntarily participating in the telemedicine visit.  I understand that I have the right to withhold or withdraw my consent to the use of telemedicine in the course of my care at any time, without affecting my right to future care or treatment, and that the Practitioner or I may terminate the telemedicine visit at any time. I understand that I have the right to inspect all information obtained and/or recorded in the course of the telemedicine visit and may receive copies of available information for a reasonable fee.  I understand that some of the potential risks of receiving the Services via telemedicine include:  Delay or interruption in medical evaluation due to technological equipment failure or disruption; Information transmitted may not be sufficient (e.g. poor resolution of images) to allow for appropriate medical decision making by the Practitioner;  and/or  In rare instances, security protocols could fail, causing a breach of personal health information.  Furthermore, I acknowledge that it is my responsibility to provide information about my medical history, conditions and care that is complete and accurate to the best of my ability. I acknowledge that Practitioner's advice, recommendations, and/or decision may be based on factors not within their control, such as incomplete or inaccurate data provided by me or distortions of diagnostic images or specimens that may result from electronic transmissions. I understand that the practice of medicine is not an exact science and that Practitioner makes no warranties or guarantees regarding treatment outcomes. I acknowledge that a copy of this consent can be made available to me via my patient portal [facility] MyChart), or I can request a printed copy by calling the office of [facility].    I understand that my insurance will be billed for this visit.   I have read or had this consent read to me. I understand the contents of this consent, which adequately explains the benefits and risks of the Services being provided via telemedicine.  I have been provided ample opportunity to ask questions regarding this consent and the Services and have had my questions answered to my satisfaction. I give my informed consent for the services to be provided through the use of telemedicine in my medical care

## 2022-11-25 NOTE — Telephone Encounter (Signed)
   Pre-operative Risk Assessment    Patient Name: [name]  DOB: August 06, 1945 MRN: [id number]      Request for Surgical Clearance    Procedure:   Cholecystectomy   Date of Surgery:  Clearance TBD                                 Surgeon:  Dr. Manus Rudd Surgeon's Group or Practice Name:  [facility] Phone number:  [phone] Fax number:  [phone]   Type of Clearance Requested:   - Medical  - Pharmacy:  Hold Aspirin Not indicated    Type of Anesthesia:  General    Additional requests/questions:    Signed, Emmit Pomfret   11/25/2022, 11:21 AM

## 2022-11-25 NOTE — Telephone Encounter (Signed)
Spoke with patient who is agreeable to do a tele visit on 7/8 at 9:20 am. Med rec and consent done.

## 2022-11-25 NOTE — Telephone Encounter (Signed)
   Name: [name]  DOB: Jan 05, 1946  MRN: [id number]  Primary Cardiologist: Tonny Bollman, MD   Preoperative team, please contact this patient and set up a phone call appointment for further preoperative risk assessment. Please obtain consent and complete medication review. Thank you for your help.  I confirm that guidance regarding antiplatelet and oral anticoagulation therapy has been completed and, if necessary, noted below.  If necessary aspirin may be held for 5 to 7 days prior to his surgery.  Please resume as soon as hemostasis is achieved.   Ronney Asters, NP 11/25/2022, 11:34 AM [facility]

## 2022-11-25 NOTE — H&P (Signed)
Subjective    Chief Complaint: New Consultation   Cardiology Dr. Tonny Bollman   History of Present Illness: [name] is a 77 y.o. male who is seen today as an office consultation at the request of Dr. Bevelyn Buckles for evaluation of New Consultation .   This is a 77 year old male who is referred by the [place] in [place] for evaluation of symptomatic cholelithiasis.  The patient has had symptoms for at least the last 3 years with intermittent pain in his right upper quadrant.  Occasionally this happens after eating a large meal.  He had a severe attack when he was on a cruise.  He underwent a CT scan in December 2022 that was unremarkable. Review of Systems: A complete review of systems was obtained from the patient.  I have reviewed this information and discussed as appropriate with the patient.  See HPI as well for other ROS.   Review of Systems  Constitutional: Negative.   HENT:  Positive for tinnitus.   Eyes: Negative.   Respiratory: Negative.    Cardiovascular: Negative.   Gastrointestinal:  Positive for abdominal pain and heartburn.  Genitourinary: Negative.   Musculoskeletal: Negative.   Skin: Negative.   Neurological: Negative.   Endo/Heme/Allergies:  Bruises/bleeds easily.  Psychiatric/Behavioral: Negative.          Medical History: Past Medical History      Past Medical History:  Diagnosis Date   Arthritis     CHF (congestive heart failure) (CMS/HHS-HCC)     GERD (gastroesophageal reflux disease)     Glaucoma (increased eye pressure)     History of cancer     Hyperlipidemia     Hypertension          Problem List     Patient Active Problem List  Diagnosis   Actinic keratosis   Allergic rhinitis   Coronary atherosclerosis of native coronary artery   Epistaxis   Erectile dysfunction   Essential hypertension   Former smoker   Gastroesophageal reflux disease   Hypertensive retinopathy   Inflamed seborrheic keratosis   Melanoma in situ of scalp  (CMS/HHS-HCC)        Past Surgical History       Past Surgical History:  Procedure Laterality Date   Extracorporeal shock wave lithotripsy       LAPAROSCOPIC URETERONEOCYSTOSTOMY WITHOUT CYSTOSCOPY AND URETERAL STENT PLACEMENT            Allergies       Allergies  Allergen Reactions   Atorvastatin Calcium Unknown      Muscle ache   Codeine Nausea   Gabapentin Other (See Comments)   Lisinopril Unknown   Doxazosin Dizziness        Medications Ordered Prior to Encounter        Current Outpatient Medications on File Prior to Visit  Medication Sig Dispense Refill   aspirin 81 MG chewable tablet Take by mouth       carvediloL (COREG) 12.5 MG tablet Take 0.5 tablets by mouth 2 (two) times daily       hydroCHLOROthiazide (HYDRODIURIL) 25 MG tablet Take 0.5 tablets by mouth once daily       losartan-hydroCHLOROthiazide (HYZAAR) 100-12.5 mg tablet Take 1 tablet by mouth once daily       nitroGLYcerin (NITROSTAT) 0.4 MG SL tablet Place under the tongue       pantoprazole (PROTONIX) 40 MG DR tablet Take by mouth       pravastatin (PRAVACHOL) 40 MG tablet Take 1 tablet  by mouth at bedtime        No current facility-administered medications on file prior to visit.        Family History  History reviewed. No pertinent family history.      Tobacco Use History  Social History        Tobacco Use  Smoking Status Former   Types: Cigarettes  Smokeless Tobacco Never        Social History  Social History         Socioeconomic History   Marital status: Legally Separated  Tobacco Use   Smoking status: Former      Types: Cigarettes   Smokeless tobacco: Never  Substance and Sexual Activity   Alcohol use: Not Currently   Drug use: Never        Objective:         Vitals:    11/25/22 0928  BP: (!) 146/82  Pulse: 51  Temp: 36.8 C (98.3 F)  SpO2: 98%  Weight: 81.2 kg (179 lb)  Height: 167.6 cm (5\' 6" )  PainSc: 0-No pain    Body mass index is 28.89 kg/m.    Physical Exam    Constitutional:  WDWN in NAD, conversant, no obvious deformities; lying in bed comfortably Eyes:  Pupils equal, round; sclera anicteric; moist conjunctiva; no lid lag HENT:  Oral mucosa moist; good dentition  Neck:  No masses palpated, trachea midline; no thyromegaly Lungs:  CTA bilaterally; normal respiratory effort CV:  Regular rate and rhythm; no murmurs; extremities well-perfused with no edema Abd:  +bowel sounds, soft, mild right upper quadrant tenderness no palpable organomegaly; no palpable hernias Musc: Normal gait; no apparent clubbing or cyanosis in extremities Lymphatic:  No palpable cervical or axillary lymphadenopathy Skin:  Warm, dry; no sign of jaundice Psychiatric - alert and oriented x 4; calm mood and affect     Labs, Imaging and Diagnostic Testing: CLINICAL DATA:  RUQ pain   EXAM: CT ABDOMEN AND PELVIS WITH CONTRAST   TECHNIQUE: Multidetector CT imaging of the abdomen and pelvis was performed using the standard protocol following bolus administration of intravenous contrast.   CONTRAST:  OMNIPAQUE IOHEXOL 300 MG/ML  SOLN   COMPARISON:  None.   FINDINGS: Lower chest: No acute abnormality.   Hepatobiliary: No focal liver abnormality is seen. No gallstones, gallbladder wall thickening, or biliary dilatation.   Pancreas: Unremarkable. No pancreatic ductal dilatation or surrounding inflammatory changes.   Spleen: Normal in size without focal abnormality.   Adrenals/Urinary Tract: Adrenal glands are unremarkable. Kidneys no hydronephrosis or nephrolithiasis. New nonspecific focal soft tissue seen adjacent to the mid and lower pole of the right kidney on series 2, image 33. Bilateral low-attenuation renal lesions, largest are compatible with simple cysts, others are too small to completely characterize. Left renal parenchymal calcification. No nephrolithiasis. Bladder is unremarkable.   Stomach/Bowel: Stomach is within normal  limits. Appendix appears normal. No evidence of bowel wall thickening, distention, or inflammatory changes.   Vascular/Lymphatic: Aortic atherosclerosis. No enlarged abdominal or pelvic lymph nodes.   Reproductive: Prostatomegaly, measuring up to 6.5 cm.   Other: Small bilateral fat containing inguinal hernias. No abdominopelvic ascites.   Musculoskeletal: No acute or significant osseous findings.   IMPRESSION: 1. No acute findings in the abdomen or pelvis. 2. New nonspecific focal soft tissue and calcification of the pararenal fat located adjacent to the mid and lower pole of the right kidney, possibly sequela of prior infection or rupture of a simple cyst. 3. Aortic  Atherosclerosis (ICD10-I70.0).     Electronically Signed   By: Allegra Lai M.D.   On: 06/01/2021 15:10     Assessment and Plan:  Diagnoses and all orders for this visit:   Calculus of gallbladder with chronic cholecystitis without obstruction       Recommend laparoscopic cholecystectomy with intraoperative cholangiogram.The surgical procedure has been discussed with the patient.  Potential risks, benefits, alternative treatments, and expected outcomes have been explained.  All of the patient's questions at this time have been answered.  The likelihood of reaching the patient's treatment goal is good.  The patient understand the proposed surgical procedure and wishes to proceed.     Taseen Marasigan Delbert Harness, MD  11/25/2022 11:41 AM
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
## 2022-12-09 ENCOUNTER — Ambulatory Visit: Payer: No Typology Code available for payment source | Attending: Cardiology | Admitting: Student

## 2022-12-09 DIAGNOSIS — Z01810 Encounter for preprocedural cardiovascular examination: Secondary | ICD-10-CM | POA: Diagnosis not present

## 2023-01-15 NOTE — Progress Notes (Addendum)
Anesthesia Review:  [facility] [place]  Cardiologist : DR Tonny Bollman LOV 04/17/22  Telephone visit on 12/09/22- [name],, NP preop exam   Hx of 2 stents  Chest x-ray : EKG : 04/17/22  Echo : 06/13/21  Stress test: 2021  Cardiac Cath :  Activity level:  can do a flgiht of stairs without difficulty  Sleep Study/ CPAP : none  Fasting Blood Sugar :      / Checks Blood Sugar -- times a day:   Blood Thinner/ Instructions /Last Dose: ASA / Instructions/ Last Dose :    81 mg aspiirn - PT has not yet received preop instructions.  Instructed pt at time of proepp on 01/20/23 to call office of DR Tsuei and/or cardilo in regards aspirin preop instructions.     PT states girlfriend returned home from [place] on 01/13/2023 with a cough.  Girlfirend has not been to [place] or been tested for Covid.  PT states they have been staying in separate areas of house.  PT denies any symptoms at time of preop on 01/20/2023.     HOH

## 2023-01-15 NOTE — Patient Instructions (Signed)
SURGICAL WAITING ROOM VISITATION  Patients having surgery or a procedure may have no more than 2 support people in the waiting area - these visitors may rotate.    Children under the age of 34 must have an adult with them who is not the patient.  Due to an increase in RSV and influenza rates and associated hospitalizations, children ages 62 and under may not visit patients in [facility] hospitals.  If the patient needs to stay at the hospital during part of their recovery, the visitor guidelines for inpatient rooms apply. Pre-op nurse will coordinate an appropriate time for 1 support person to accompany patient in pre-op.  This support person may not rotate.    Please refer to the [facility] website for the visitor guidelines for Inpatients (after your surgery is over and you are in a regular room).       Your procedure is scheduled on: 01/30/23     Report to [facility] Main Entrance    Report to admitting at  0515 AM   Call this number if you have problems the morning of surgery [phone]   Do not eat food :After Midnight.   After Midnight you may have the following liquids until _ 0430_____ AM DAY OF SURGERY  Water Non-Citrus Juices (without pulp, NO RED-Apple, White grape, White cranberry) Black Coffee (NO MILK/CREAM OR CREAMERS, sugar ok)  Clear Tea (NO MILK/CREAM OR CREAMERS, sugar ok) regular and decaf                             Plain Jell-O (NO RED)                                           Fruit ices (not with fruit pulp, NO RED)                                     Popsicles (NO RED)                                                               Sports drinks like Gatorade (NO RED)                    The day of surgery:  Drink ONE (1) Pre-Surgery Clear Ensure or G2 at   0430AM  ( have completed by ) the morning of surgery. Drink in one sitting. Do not sip.  This drink was given to you during your hospital  pre-op appointment visit. Nothing else to  drink after completing the  Pre-Surgery Clear Ensure or G2.          If you have questions, please contact your surgeon's office.       Oral Hygiene is also important to reduce your risk of infection.                                    Remember - BRUSH YOUR TEETH THE MORNING OF SURGERY WITH  YOUR REGULAR TOOTHPASTE  DENTURES WILL BE REMOVED PRIOR TO SURGERY PLEASE DO NOT APPLY "Poly grip" OR ADHESIVES!!!   Do NOT smoke after Midnight   Stop all vitamins and herbal supplements 7 days before surgery.   Take these medicines the morning of surgery with A SIP OF WATER:  coreg   DO NOT TAKE ANY ORAL DIABETIC MEDICATIONS DAY OF YOUR SURGERY  Bring CPAP mask and tubing day of surgery.                              You may not have any metal on your body including hair pins, jewelry, and body piercing             Do not wear make-up, lotions, powders, perfumes/cologne, or deodorant  Do not wear nail polish including gel and S&S, artificial/acrylic nails, or any other type of covering on natural nails including finger and toenails. If you have artificial nails, gel coating, etc. that needs to be removed by a nail salon please have this removed prior to surgery or surgery may need to be canceled/ delayed if the surgeon/ anesthesia feels like they are unable to be safely monitored.   Do not shave  48 hours prior to surgery.               Men may shave face and neck.   Do not bring valuables to the hospital. [facility] IS NOT             RESPONSIBLE   FOR VALUABLES.   Contacts, glasses, dentures or bridgework may not be worn into surgery.   Bring small overnight bag day of surgery.   DO NOT BRING YOUR HOME MEDICATIONS TO THE HOSPITAL. PHARMACY WILL DISPENSE MEDICATIONS LISTED ON YOUR MEDICATION LIST TO YOU DURING YOUR ADMISSION IN THE HOSPITAL!    Patients discharged on the day of surgery will not be allowed to drive home.  Someone NEEDS to stay with you for the first 24 hours after  anesthesia.   Special Instructions: Bring a copy of your healthcare power of attorney and living will documents the day of surgery if you haven't scanned them before.              Please read over the following fact sheets you were given: IF YOU HAVE QUESTIONS ABOUT YOUR PRE-OP INSTRUCTIONS PLEASE CALL [phone]   If you received a COVID test during your pre-op visit  it is requested that you wear a mask when out in public, stay away from anyone that may not be feeling well and notify your surgeon if you develop symptoms. If you test positive for Covid or have been in contact with anyone that has tested positive in the last 10 days please notify you surgeon.    [facility] - Preparing for Surgery Before surgery, you can play an important role.  Because skin is not sterile, your skin needs to be as free of germs as possible.  You can reduce the number of germs on your skin by washing with CHG (chlorahexidine gluconate) soap before surgery.  CHG is an antiseptic cleaner which kills germs and bonds with the skin to continue killing germs even after washing. Please DO NOT use if you have an allergy to CHG or antibacterial soaps.  If your skin becomes reddened/irritated stop using the CHG and inform your nurse when you arrive at Short Stay. Do not shave (including legs and  underarms) for at least 48 hours prior to the first CHG shower.  You may shave your face/neck. Please follow these instructions carefully:  1.  Shower with CHG Soap the night before surgery and the  morning of Surgery.  2.  If you choose to wash your hair, wash your hair first as usual with your  normal  shampoo.  3.  After you shampoo, rinse your hair and body thoroughly to remove the  shampoo.                           4.  Use CHG as you would any other liquid soap.  You can apply chg directly  to the skin and wash                       Gently with a scrungie or clean washcloth.  5.  Apply the CHG Soap to your body ONLY FROM THE  NECK DOWN.   Do not use on face/ open                           Wound or open sores. Avoid contact with eyes, ears mouth and genitals (private parts).                       Wash face,  Genitals (private parts) with your normal soap.             6.  Wash thoroughly, paying special attention to the area where your surgery  will be performed.  7.  Thoroughly rinse your body with warm water from the neck down.  8.  DO NOT shower/wash with your normal soap after using and rinsing off  the CHG Soap.                9.  Pat yourself dry with a clean towel.            10.  Wear clean pajamas.            11.  Place clean sheets on your bed the night of your first shower and do not  sleep with pets. Day of Surgery : Do not apply any lotions/deodorants the morning of surgery.  Please wear clean clothes to the hospital/surgery center.  FAILURE TO FOLLOW THESE INSTRUCTIONS MAY RESULT IN THE CANCELLATION OF YOUR SURGERY PATIENT SIGNATURE_________________________________  NURSE SIGNATURE__________________________________  ________________________________________________________________________

## 2023-01-20 ENCOUNTER — Encounter (HOSPITAL_COMMUNITY)
Admission: RE | Admit: 2023-01-20 | Discharge: 2023-01-20 | Disposition: A | Discharge status: Home / Self Care | Payer: No Typology Code available for payment source | Source: Ambulatory Visit | Attending: Surgery | Admitting: Surgery

## 2023-01-20 ENCOUNTER — Encounter (HOSPITAL_COMMUNITY): Payer: Self-pay

## 2023-01-20 ENCOUNTER — Other Ambulatory Visit: Payer: Self-pay

## 2023-01-20 VITALS — BP 153/72 | HR 57 | Temp 98.0°F | Resp 16 | Ht 66.0 in | Wt 174.0 lb

## 2023-01-20 DIAGNOSIS — Z01812 Encounter for preprocedural laboratory examination: Secondary | ICD-10-CM | POA: Insufficient documentation

## 2023-01-20 DIAGNOSIS — Z01818 Encounter for other preprocedural examination: Secondary | ICD-10-CM

## 2023-01-20 HISTORY — DX: Personal history of urinary calculi: Z87.442

## 2023-01-20 LAB — BASIC METABOLIC PANEL WITH GFR
Anion gap: 9 (ref 5–15)
BUN: 16 mg/dL (ref 8–23)
CO2: 27 mmol/L (ref 22–32)
Calcium: 9.4 mg/dL (ref 8.9–10.3)
Chloride: 104 mmol/L (ref 98–111)
Creatinine, Ser: 1.12 mg/dL (ref 0.61–1.24)
GFR, Estimated: >60 mL/min
Glucose, Bld: 111 mg/dL — ABNORMAL HIGH (ref 70–99)
Potassium: 4.3 mmol/L (ref 3.5–5.1)
Sodium: 140 mmol/L (ref 135–145)

## 2023-01-20 LAB — CBC
HCT: 44.7 % (ref 39.0–52.0)
Hemoglobin: 14.7 g/dL (ref 13.0–17.0)
MCH: 30.1 pg (ref 26.0–34.0)
MCHC: 32.9 g/dL (ref 30.0–36.0)
MCV: 91.6 fL (ref 80.0–100.0)
Platelets: 241 K/uL (ref 150–400)
RBC: 4.88 MIL/uL (ref 4.22–5.81)
RDW: 12.9 % (ref 11.5–15.5)
WBC: 6.8 K/uL (ref 4.0–10.5)
nRBC: 0 % (ref 0.0–0.2)

## 2023-01-22 NOTE — Anesthesia Preprocedure Evaluation (Addendum)
Anesthesia Evaluation  Patient identified by MRN, date of birth, ID band Patient awake    Reviewed: Allergy & Precautions, NPO status , Patient's Chart, lab work & pertinent test results  Airway Mallampati: II  TM Distance: >3 FB Neck ROM: Full    Dental no notable dental hx. (+) Dental Advisory Given, Teeth Intact   Pulmonary former smoker   Pulmonary exam normal breath sounds clear to auscultation       Cardiovascular hypertension, Pt. on home beta blockers and Pt. on medications + CAD, + Past MI and + Cardiac Stents  Normal cardiovascular exam Rhythm:Regular Rate:Normal  Echo 06/2021  1. Left ventricular ejection fraction, by estimation, is 50 to 55%. The left ventricle has low normal function. The left ventricle demonstrates regional wall motion abnormalities with akinesis of the basal inferoseptal and basal anteroseptal wall segments and hypokinesis of the apical cap. Left ventricular diastolic parameters are consistent with Grade I diastolic dysfunction (impaired relaxation).   2. Right ventricular systolic function is normal. The right ventricular size is normal. Tricuspid regurgitation signal is inadequate for assessing PA pressure.   3. The mitral valve is normal in structure. No evidence of mitral valve regurgitation. No evidence of mitral stenosis.   4. The aortic valve is tricuspid. Aortic valve regurgitation is not visualized. Aortic valve sclerosis/calcification is present, without any evidence of aortic stenosis.   5. Aortic dilatation noted. There is mild dilatation of the ascending aorta, measuring 39 mm.   6. The inferior vena cava is normal in size with greater than 50% respiratory variability, suggesting right atrial pressure of 3 mmHg.     Myocardial Perfusion 09/22/2019  The left ventricular ejection fraction is normal (55-65%).  Nuclear stress EF: 56%.  There was no ST segment deviation noted during stress.     Neuro/Psych negative neurological ROS     GI/Hepatic Neg liver ROS,GERD  ,,  Endo/Other  negative endocrine ROS    Renal/GU Renal disease     Musculoskeletal negative musculoskeletal ROS (+)    Abdominal   Peds  Hematology  (+) Blood dyscrasia, anemia   Anesthesia Other Findings   Reproductive/Obstetrics                              Anesthesia Physical Anesthesia Plan  ASA: 3  Anesthesia Plan: General   Post-op Pain Management: Tylenol PO (pre-op)*   Induction: Intravenous  PONV Risk Score and Plan: 3 and Ondansetron, Dexamethasone and Treatment may vary due to age or medical condition  Airway Management Planned: Oral ETT  Additional Equipment:   Intra-op Plan:   Post-operative Plan: Extubation in OR  Informed Consent: I have reviewed the patients History and Physical, chart, labs and discussed the procedure including the risks, benefits and alternatives for the proposed anesthesia with the patient or authorized representative who has indicated his/her understanding and acceptance.     Dental advisory given  Plan Discussed with: CRNA  Anesthesia Plan Comments: (See PAT note 01/20/2023)        Anesthesia Quick Evaluation
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
## 2023-01-30 ENCOUNTER — Ambulatory Visit (HOSPITAL_COMMUNITY)
Admission: RE | Admit: 2023-01-30 | Discharge: 2023-01-30 | Disposition: A | Discharge status: Home / Self Care | Payer: No Typology Code available for payment source | Attending: Surgery | Admitting: Surgery

## 2023-01-30 ENCOUNTER — Encounter (HOSPITAL_COMMUNITY)
Admission: RE | Disposition: A | Discharge status: Home / Self Care | Payer: Self-pay | Source: Home / Self Care | Attending: Surgery

## 2023-01-30 ENCOUNTER — Encounter (HOSPITAL_COMMUNITY): Payer: Self-pay | Admitting: Surgery

## 2023-01-30 ENCOUNTER — Ambulatory Visit (HOSPITAL_BASED_OUTPATIENT_CLINIC_OR_DEPARTMENT_OTHER): Payer: No Typology Code available for payment source | Admitting: Anesthesiology

## 2023-01-30 ENCOUNTER — Ambulatory Visit (HOSPITAL_COMMUNITY): Payer: No Typology Code available for payment source | Admitting: Physician Assistant

## 2023-01-30 ENCOUNTER — Ambulatory Visit (HOSPITAL_COMMUNITY): Payer: No Typology Code available for payment source

## 2023-01-30 ENCOUNTER — Other Ambulatory Visit: Payer: Self-pay

## 2023-01-30 DIAGNOSIS — K219 Gastro-esophageal reflux disease without esophagitis: Secondary | ICD-10-CM | POA: Insufficient documentation

## 2023-01-30 DIAGNOSIS — Z87891 Personal history of nicotine dependence: Secondary | ICD-10-CM | POA: Diagnosis not present

## 2023-01-30 DIAGNOSIS — I252 Old myocardial infarction: Secondary | ICD-10-CM | POA: Insufficient documentation

## 2023-01-30 DIAGNOSIS — I2510 Atherosclerotic heart disease of native coronary artery without angina pectoris: Secondary | ICD-10-CM | POA: Diagnosis not present

## 2023-01-30 DIAGNOSIS — K8010 Calculus of gallbladder with chronic cholecystitis without obstruction: Secondary | ICD-10-CM

## 2023-01-30 DIAGNOSIS — Z01818 Encounter for other preprocedural examination: Secondary | ICD-10-CM

## 2023-01-30 DIAGNOSIS — Z955 Presence of coronary angioplasty implant and graft: Secondary | ICD-10-CM | POA: Insufficient documentation

## 2023-01-30 DIAGNOSIS — K8020 Calculus of gallbladder without cholecystitis without obstruction: Secondary | ICD-10-CM | POA: Diagnosis present

## 2023-01-30 DIAGNOSIS — I110 Hypertensive heart disease with heart failure: Secondary | ICD-10-CM | POA: Diagnosis not present

## 2023-01-30 DIAGNOSIS — I10 Essential (primary) hypertension: Secondary | ICD-10-CM

## 2023-01-30 HISTORY — PX: CHOLECYSTECTOMY: SHX55

## 2023-01-30 SURGERY — LAPAROSCOPIC CHOLECYSTECTOMY WITH INTRAOPERATIVE CHOLANGIOGRAM
Anesthesia: General

## 2023-01-30 MED ORDER — OXYCODONE HCL 5 MG PO TABS: 5.0000 mg | ORAL_TABLET | Freq: Four times a day (QID) | ORAL | 0 refills | Status: DC | PRN

## 2023-01-30 MED ORDER — SUGAMMADEX SODIUM 200 MG/2ML IV SOLN
INTRAVENOUS | Status: DC | PRN
  Administered 2023-01-30: 200 mg via INTRAVENOUS

## 2023-01-30 MED ORDER — DEXAMETHASONE SODIUM PHOSPHATE 10 MG/ML IJ SOLN
INTRAMUSCULAR | Status: AC
  Filled 2023-01-30: qty 1

## 2023-01-30 MED ORDER — CHLORHEXIDINE GLUCONATE 0.12 % MT SOLN
15.0000 mL | Freq: Once | OROMUCOSAL | Status: AC
  Administered 2023-01-30: 15 mL via OROMUCOSAL

## 2023-01-30 MED ORDER — ORAL CARE MOUTH RINSE: 15.0000 mL | Freq: Once | OROMUCOSAL | Status: AC

## 2023-01-30 MED ORDER — BUPIVACAINE-EPINEPHRINE 0.25% -1:200000 IJ SOLN
INTRAMUSCULAR | Status: DC | PRN
  Administered 2023-01-30: 10 mL

## 2023-01-30 MED ORDER — DEXAMETHASONE SODIUM PHOSPHATE 4 MG/ML IJ SOLN
INTRAMUSCULAR | Status: DC | PRN
  Administered 2023-01-30: 8 mg via INTRAVENOUS

## 2023-01-30 MED ORDER — GLYCOPYRROLATE 0.2 MG/ML IJ SOLN
INTRAMUSCULAR | Status: DC | PRN
  Administered 2023-01-30: .2 mg via INTRAVENOUS

## 2023-01-30 MED ORDER — ROCURONIUM BROMIDE 100 MG/10ML IV SOLN
INTRAVENOUS | Status: DC | PRN
  Administered 2023-01-30: 50 mg via INTRAVENOUS

## 2023-01-30 MED ORDER — DROPERIDOL 2.5 MG/ML IJ SOLN: 0.6250 mg | Freq: Once | INTRAMUSCULAR | Status: AC | PRN

## 2023-01-30 MED ORDER — 0.9 % SODIUM CHLORIDE (POUR BTL) OPTIME
TOPICAL | Status: DC | PRN
  Administered 2023-01-30: 1000 mL

## 2023-01-30 MED ORDER — FENTANYL CITRATE PF 50 MCG/ML IJ SOSY
25.0000 ug | PREFILLED_SYRINGE | INTRAMUSCULAR | Status: DC | PRN
  Administered 2023-01-30: 25 ug via INTRAVENOUS

## 2023-01-30 MED ORDER — EPHEDRINE SULFATE (PRESSORS) 50 MG/ML IJ SOLN
INTRAMUSCULAR | Status: DC | PRN
  Administered 2023-01-30 (×3): 5 mg via INTRAVENOUS

## 2023-01-30 MED ORDER — PROPOFOL 10 MG/ML IV BOLUS
INTRAVENOUS | Status: DC | PRN
Start: 2023-01-30 — End: 2023-01-30
  Administered 2023-01-30: 100 mg via INTRAVENOUS

## 2023-01-30 MED ORDER — PROPOFOL 10 MG/ML IV BOLUS
INTRAVENOUS | Status: AC
  Filled 2023-01-30: qty 20

## 2023-01-30 MED ORDER — FENTANYL CITRATE PF 50 MCG/ML IJ SOSY
PREFILLED_SYRINGE | INTRAMUSCULAR | Status: AC
  Administered 2023-01-30: 25 ug via INTRAVENOUS
  Filled 2023-01-30: qty 1

## 2023-01-30 MED ORDER — LACTATED RINGERS IV SOLN: INTRAVENOUS | Status: DC

## 2023-01-30 MED ORDER — LACTATED RINGERS IR SOLN
Status: DC | PRN
  Administered 2023-01-30: 1000 mL

## 2023-01-30 MED ORDER — ACETAMINOPHEN 500 MG PO TABS: 1000.0000 mg | ORAL_TABLET | Freq: Once | ORAL | Status: DC

## 2023-01-30 MED ORDER — ONDANSETRON HCL 4 MG/2ML IJ SOLN
INTRAMUSCULAR | Status: AC
  Filled 2023-01-30: qty 2

## 2023-01-30 MED ORDER — ROCURONIUM BROMIDE 10 MG/ML (PF) SYRINGE
PREFILLED_SYRINGE | INTRAVENOUS | Status: AC
  Filled 2023-01-30: qty 10

## 2023-01-30 MED ORDER — FENTANYL CITRATE (PF) 250 MCG/5ML IJ SOLN
INTRAMUSCULAR | Status: AC
  Filled 2023-01-30: qty 5

## 2023-01-30 MED ORDER — CEFAZOLIN SODIUM-DEXTROSE 2-4 GM/100ML-% IV SOLN
2.0000 g | INTRAVENOUS | Status: AC
  Administered 2023-01-30: 2 g via INTRAVENOUS
  Filled 2023-01-30: qty 100

## 2023-01-30 MED ORDER — SODIUM CHLORIDE 0.9 % IV SOLN
INTRAVENOUS | Status: DC | PRN
  Administered 2023-01-30: 5 mL

## 2023-01-30 MED ORDER — PHENYLEPHRINE 80 MCG/ML (10ML) SYRINGE FOR IV PUSH (FOR BLOOD PRESSURE SUPPORT)
PREFILLED_SYRINGE | INTRAVENOUS | Status: AC
  Filled 2023-01-30: qty 20

## 2023-01-30 MED ORDER — ACETAMINOPHEN 500 MG PO TABS
1000.0000 mg | ORAL_TABLET | ORAL | Status: AC
  Administered 2023-01-30: 1000 mg via ORAL
  Filled 2023-01-30: qty 2

## 2023-01-30 MED ORDER — BUPIVACAINE-EPINEPHRINE 0.25% -1:200000 IJ SOLN
INTRAMUSCULAR | Status: AC
  Filled 2023-01-30: qty 1

## 2023-01-30 MED ORDER — FENTANYL CITRATE (PF) 100 MCG/2ML IJ SOLN
INTRAMUSCULAR | Status: DC | PRN
  Administered 2023-01-30 (×4): 50 ug via INTRAVENOUS

## 2023-01-30 MED ORDER — DROPERIDOL 2.5 MG/ML IJ SOLN
INTRAMUSCULAR | Status: AC
  Administered 2023-01-30: 0.625 mg via INTRAVENOUS
  Filled 2023-01-30: qty 2

## 2023-01-30 MED ORDER — GLYCOPYRROLATE 0.2 MG/ML IJ SOLN
INTRAMUSCULAR | Status: AC
  Filled 2023-01-30: qty 1

## 2023-01-30 MED ORDER — EPHEDRINE 5 MG/ML INJ
INTRAVENOUS | Status: AC
  Filled 2023-01-30: qty 5

## 2023-01-30 MED ORDER — ONDANSETRON HCL 4 MG/2ML IJ SOLN
INTRAMUSCULAR | Status: DC | PRN
  Administered 2023-01-30: 4 mg via INTRAVENOUS

## 2023-01-30 MED ORDER — CHLORHEXIDINE GLUCONATE CLOTH 2 % EX PADS: 6.0000 | MEDICATED_PAD | Freq: Once | CUTANEOUS | Status: DC

## 2023-01-30 SURGICAL SUPPLY — 41 items
APL PRP STRL LF DISP 70% ISPRP (MISCELLANEOUS) ×1
APL SKNCLS STERI-STRIP NONHPOA (GAUZE/BANDAGES/DRESSINGS) ×1
APPLIER CLIP ROT 10 11.4 M/L (STAPLE) ×1
APR CLP MED LRG 11.4X10 (STAPLE) ×1
BAG SPEC RTRVL 10 TROC 200 (ENDOMECHANICALS) ×1
BENZOIN TINCTURE PRP APPL 2/3 (GAUZE/BANDAGES/DRESSINGS) ×1 IMPLANT
CABLE HIGH FREQUENCY MONO STRZ (ELECTRODE) ×1 IMPLANT
CHLORAPREP W/TINT 26 (MISCELLANEOUS) ×1 IMPLANT
CLIP APPLIE ROT 10 11.4 M/L (STAPLE) ×1 IMPLANT
COVER MAYO STAND XLG (MISCELLANEOUS) ×1 IMPLANT
COVER SURGICAL LIGHT HANDLE (MISCELLANEOUS) ×1 IMPLANT
DRAPE C-ARM 42X120 X-RAY (DRAPES) ×1 IMPLANT
DRSG TEGADERM 2-3/8X2-3/4 SM (GAUZE/BANDAGES/DRESSINGS) ×3 IMPLANT
DRSG TEGADERM 4X4.75 (GAUZE/BANDAGES/DRESSINGS) ×1 IMPLANT
ELECT REM PT RETURN 15FT ADLT (MISCELLANEOUS) ×1 IMPLANT
GAUZE SPONGE 2X2 8PLY STRL LF (GAUZE/BANDAGES/DRESSINGS) IMPLANT
GLOVE BIO SURGEON STRL SZ7 (GLOVE) ×1 IMPLANT
GLOVE BIOGEL PI IND STRL 7.5 (GLOVE) ×1 IMPLANT
GOWN STRL REUS W/ TWL LRG LVL3 (GOWN DISPOSABLE) ×1 IMPLANT
GOWN STRL REUS W/TWL LRG LVL3 (GOWN DISPOSABLE) ×1
IRRIG SUCT STRYKERFLOW 2 WTIP (MISCELLANEOUS) ×1
IRRIGATION SUCT STRKRFLW 2 WTP (MISCELLANEOUS) ×1 IMPLANT
KIT BASIN OR (CUSTOM PROCEDURE TRAY) ×1 IMPLANT
KIT TURNOVER KIT A (KITS) IMPLANT
NS IRRIG 1000ML POUR BTL (IV SOLUTION) ×1 IMPLANT
POUCH RETRIEVAL ECOSAC 10 (ENDOMECHANICALS) IMPLANT
SCISSORS LAP 5X35 DISP (ENDOMECHANICALS) ×1 IMPLANT
SET CHOLANGIOGRAPH MIX (MISCELLANEOUS) ×1 IMPLANT
SET TUBE SMOKE EVAC HIGH FLOW (TUBING) ×1 IMPLANT
SLEEVE Z-THREAD 5X100MM (TROCAR) ×1 IMPLANT
SPIKE FLUID TRANSFER (MISCELLANEOUS) ×1 IMPLANT
STRIP CLOSURE SKIN 1/2X4 (GAUZE/BANDAGES/DRESSINGS) ×1 IMPLANT
SUT MNCRL AB 4-0 PS2 18 (SUTURE) ×1 IMPLANT
SYS BAG RETRIEVAL 10MM (BASKET)
SYSTEM BAG RETRIEVAL 10MM (BASKET) IMPLANT
TOWEL OR 17X26 10 PK STRL BLUE (TOWEL DISPOSABLE) ×1 IMPLANT
TOWEL OR NON WOVEN STRL DISP B (DISPOSABLE) ×1 IMPLANT
TRAY LAPAROSCOPIC (CUSTOM PROCEDURE TRAY) ×1 IMPLANT
TROCAR 11X100 Z THREAD (TROCAR) ×1 IMPLANT
TROCAR BALLN 12MMX100 BLUNT (TROCAR) ×1 IMPLANT
TROCAR Z-THREAD OPTICAL 5X100M (TROCAR) ×1 IMPLANT

## 2023-01-30 NOTE — Anesthesia Postprocedure Evaluation (Signed)
Anesthesia Post Note  Patient: [name]  Procedure(s) Performed: LAPAROSCOPIC CHOLECYSTECTOMY WITH INTRAOPERATIVE CHOLANGIOGRAM     Patient location during evaluation: PACU Anesthesia Type: General Level of consciousness: sedated and patient cooperative Pain management: pain level controlled Vital Signs Assessment: post-procedure vital signs reviewed and stable Respiratory status: spontaneous breathing Cardiovascular status: stable Anesthetic complications: no   No notable events documented.  Last Vitals:  Vitals:   01/30/23 0938 01/30/23 1100  BP: 136/73 138/69  Pulse: (!) 58 60  Resp: 13 16  Temp: 36.7 C   SpO2: 93% 94%    Last Pain:  Vitals:   01/30/23 1100  TempSrc:   PainSc: 2                  Lewie Loron

## 2023-01-30 NOTE — Transfer of Care (Signed)
Immediate Anesthesia Transfer of Care Note  Patient: [name]  Procedure(s) Performed: LAPAROSCOPIC CHOLECYSTECTOMY WITH INTRAOPERATIVE CHOLANGIOGRAM  Patient Location: PACU  Anesthesia Type:General  Level of Consciousness: drowsy and patient cooperative  Airway & Oxygen Therapy: Patient Spontanous Breathing and Patient connected to face mask oxygen  Post-op Assessment: Report given to RN and Post -op Vital signs reviewed and stable  Post vital signs: Reviewed and stable  Last Vitals:  Vitals Value Taken Time  BP 150/74 01/30/23 0849  Temp    Pulse 60 01/30/23 0851  Resp 13 01/30/23 0851  SpO2 100 % 01/30/23 0851  Vitals shown include unfiled device data.  Last Pain:  Vitals:   01/30/23 0615  TempSrc: Oral  PainSc:          Complications: No notable events documented.

## 2023-01-30 NOTE — Op Note (Signed)
Laparoscopic Cholecystectomy with IOC Procedure Note  Indications: This is a 77 year old male who is referred by the [place] in [place] for evaluation of symptomatic cholelithiasis.  The patient has had symptoms for at least the last 3 years with intermittent pain in his right upper quadrant.  Occasionally this happens after eating a large meal.  He had a severe attack when he was on a cruise.  He underwent a CT scan in December 2022 that was unremarkable.  Ultrasound in December 2023 revealed cholelithiasis.  LFT's have been normal.   Pre-operative Diagnosis: Calculus of gallbladder with other cholecystitis, without mention of obstruction  Post-operative Diagnosis: Same  Surgeon: Wynona Luna   Assistants: none  Anesthesia: General endotracheal anesthesia  ASA Class: 2  Procedure Details  The patient was seen again in the Holding Room. The risks, benefits, complications, treatment options, and expected outcomes were discussed with the patient. The possibilities of reaction to medication, pulmonary aspiration, perforation of viscus, bleeding, recurrent infection, finding a normal gallbladder, the need for additional procedures, failure to diagnose a condition, the possible need to convert to an open procedure, and creating a complication requiring transfusion or operation were discussed with the patient. The likelihood of improving the patient's symptoms with return to their baseline status is good.  The patient and/or family concurred with the proposed plan, giving informed consent. The site of surgery properly noted. The patient was taken to Operating Room, identified as [name] and the procedure verified as Laparoscopic Cholecystectomy with Intraoperative Cholangiogram. A Time Out was held and the above information confirmed.  Prior to the induction of general anesthesia, antibiotic prophylaxis was administered. General endotracheal anesthesia was then administered and tolerated well.  After the induction, the abdomen was prepped with Chloraprep and draped in the sterile fashion. The patient was positioned in the supine position.  The patient has a previous upper midline incision from a ventral hernia repair with mesh.  Local anesthetic agent was injected into the skin below the umbilicus and an incision made. We dissected down to the abdominal fascia with blunt dissection.  The fascia was incised vertically and we entered the peritoneal cavity bluntly.  A pursestring suture of 0-Vicryl was placed around the fascial opening.  The Hasson cannula was inserted and secured with the stay suture.  Pneumoperitoneum was then created with CO2 and tolerated well without any adverse changes in the patient's vital signs. The patient has adhesions in the upper midline to the hernia mesh, but the RUQ is free of adhesions. An 11-mm port was placed in the subxiphoid position to the right of the midline incision.  Two 5-mm ports were placed in the right upper quadrant. All skin incisions were infiltrated with a local anesthetic agent before making the incision and placing the trocars.   We positioned the patient in reverse Trendelenburg, tilted slightly to the patient's left.  The gallbladder was identified, the fundus grasped and retracted cephalad. He has significant omental adhesions to the edge of the liver and the gallbladder.  Adhesions were lysed bluntly and with the electrocautery where indicated, taking care not to injure any adjacent organs or viscus. The infundibulum was grasped and retracted laterally, exposing the peritoneum overlying the triangle of Calot. This was then divided and exposed in a blunt fashion. A critical view of the cystic duct and cystic artery was obtained.  The cystic duct was clearly identified and bluntly dissected circumferentially. The cystic duct was ligated with a clip distally.   An incision  was made in the cystic duct and the [facility] cholangiogram catheter introduced. The  catheter was secured using a clip. A cholangiogram was then obtained which showed good visualization of the distal and proximal biliary tree with no sign of filling defects or obstruction.  Contrast flowed easily into the duodenum. The catheter was then removed.   The cystic duct was then ligated with clips and divided. The cystic artery was identified, dissected free, ligated with clips and divided as well.   The gallbladder was dissected from the liver bed in retrograde fashion with the electrocautery. The gallbladder was removed and placed in an Eco sac. The liver bed was irrigated and inspected. Hemostasis was achieved with the electrocautery. Copious irrigation was utilized and was repeatedly aspirated until clear.  The gallbladder and Eco sac were then removed through the umbilical port site.  The pursestring suture was used to close the umbilical fascia.    We again inspected the right upper quadrant for hemostasis.  Pneumoperitoneum was released as we removed the trocars.  4-0 Monocryl was used to close the skin.   Benzoin, steri-strips, and clean dressings were applied. The patient was then extubated and brought to the recovery room in stable condition. Instrument, sponge, and needle counts were correct at closure and at the conclusion of the case.   Findings: Cholecystitis with Cholelithiasis  Estimated Blood Loss: Minimal         Drains: none         Specimens: Gallbladder           Complications: None; patient tolerated the procedure well.         Disposition: PACU - hemodynamically stable.         Condition: stable  Wilmon Arms. Corliss Skains, MD, [facility]  General Surgery   01/30/2023 8:48 AM

## 2023-01-30 NOTE — Discharge Instructions (Signed)

## 2023-01-30 NOTE — Anesthesia Procedure Notes (Signed)
Procedure Name: Intubation Date/Time: 01/30/2023 7:41 AM  Performed by: Ahmed Prima, CRNAPre-anesthesia Checklist: Patient identified, Emergency Drugs available, Suction available and Patient being monitored Patient Re-evaluated:Patient Re-evaluated prior to induction Oxygen Delivery Method: Circle System Utilized Preoxygenation: Pre-oxygenation with 100% oxygen Induction Type: IV induction and Cricoid Pressure applied Ventilation: Mask ventilation without difficulty and Oral airway inserted - appropriate to patient size Laryngoscope Size: Hyacinth Meeker and 2 Grade View: Grade II Tube type: Oral Tube size: 7.5 mm Number of attempts: 1 Airway Equipment and Method: Stylet and Oral airway Placement Confirmation: ETT inserted through vocal cords under direct vision, positive ETCO2 and breath sounds checked- equal and bilateral Secured at: 23 cm Tube secured with: Tape Dental Injury: Teeth and Oropharynx as per pre-operative assessment  Comments: Grade 2B view. Pt. Has limited neck movement.

## 2023-01-30 NOTE — H&P (Addendum)
Subjective    Chief Complaint: New Consultation   Cardiology Dr. Tonny Bollman   History of Present Illness: [name] is a 77 y.o. male who is seen today as an office consultation at the request of Dr. Bevelyn Buckles for evaluation of New Consultation .   This is a 77 year old male who is referred by the [place] in [place] for evaluation of symptomatic cholelithiasis.  The patient has had symptoms for at least the last 3 years with intermittent pain in his right upper quadrant.  Occasionally this happens after eating a large meal.  He had a severe attack when he was on a cruise.  He underwent a CT scan in December 2022 that was unremarkable.  Ultrasound in December 2023 revealed cholelithiasis.  LFT's have been normal. Review of Systems: A complete review of systems was obtained from the patient.  I have reviewed this information and discussed as appropriate with the patient.  See HPI as well for other ROS.   Review of Systems  Constitutional: Negative.   HENT:  Positive for tinnitus.   Eyes: Negative.   Respiratory: Negative.    Cardiovascular: Negative.   Gastrointestinal:  Positive for abdominal pain and heartburn.  Genitourinary: Negative.   Musculoskeletal: Negative.   Skin: Negative.   Neurological: Negative.   Endo/Heme/Allergies:  Bruises/bleeds easily.  Psychiatric/Behavioral: Negative.          Medical History: Past Medical History         Past Medical History:  Diagnosis Date   Arthritis     CHF (congestive heart failure) (CMS/HHS-HCC)     GERD (gastroesophageal reflux disease)     Glaucoma (increased eye pressure)     History of cancer     Hyperlipidemia     Hypertension          Problem List       Patient Active Problem List  Diagnosis   Actinic keratosis   Allergic rhinitis   Coronary atherosclerosis of native coronary artery   Epistaxis   Erectile dysfunction   Essential hypertension   Former smoker   Gastroesophageal reflux disease   Hypertensive  retinopathy   Inflamed seborrheic keratosis   Melanoma in situ of scalp (CMS/HHS-HCC)        Past Surgical History           Past Surgical History:  Procedure Laterality Date   Extracorporeal shock wave lithotripsy       LAPAROSCOPIC URETERONEOCYSTOSTOMY WITHOUT CYSTOSCOPY AND URETERAL STENT PLACEMENT            Allergies           Allergies  Allergen Reactions   Atorvastatin Calcium Unknown      Muscle ache   Codeine Nausea   Gabapentin Other (See Comments)   Lisinopril Unknown   Doxazosin Dizziness        Medications Ordered Prior to Encounter             Current Outpatient Medications on File Prior to Visit  Medication Sig Dispense Refill   aspirin 81 MG chewable tablet Take by mouth       carvediloL (COREG) 12.5 MG tablet Take 0.5 tablets by mouth 2 (two) times daily       hydroCHLOROthiazide (HYDRODIURIL) 25 MG tablet Take 0.5 tablets by mouth once daily       losartan-hydroCHLOROthiazide (HYZAAR) 100-12.5 mg tablet Take 1 tablet by mouth once daily       nitroGLYcerin (NITROSTAT) 0.4 MG SL tablet Place under the  tongue       pantoprazole (PROTONIX) 40 MG DR tablet Take by mouth       pravastatin (PRAVACHOL) 40 MG tablet Take 1 tablet by mouth at bedtime        No current facility-administered medications on file prior to visit.        Family History  History reviewed. No pertinent family history.      Tobacco Use History  Social History           Tobacco Use  Smoking Status Former   Types: Cigarettes  Smokeless Tobacco Never        Social History  Social History             Socioeconomic History   Marital status: Legally Separated  Tobacco Use   Smoking status: Former      Types: Cigarettes   Smokeless tobacco: Never  Substance and Sexual Activity   Alcohol use: Not Currently   Drug use: Never        Objective:           Vitals:    11/25/22 0928  BP: (!) 146/82  Pulse: 51  Temp: 36.8 C (98.3 F)  SpO2: 98%  Weight: 81.2 kg  (179 lb)  Height: 167.6 cm (5\' 6" )  PainSc: 0-No pain    Body mass index is 28.89 kg/m.   Physical Exam    Constitutional:  WDWN in NAD, conversant, no obvious deformities; lying in bed comfortably Eyes:  Pupils equal, round; sclera anicteric; moist conjunctiva; no lid lag HENT:  Oral mucosa moist; good dentition  Neck:  No masses palpated, trachea midline; no thyromegaly Lungs:  CTA bilaterally; normal respiratory effort CV:  Regular rate and rhythm; no murmurs; extremities well-perfused with no edema Abd:  +bowel sounds, soft, mild right upper quadrant tenderness no palpable organomegaly; no palpable hernias Musc: Normal gait; no apparent clubbing or cyanosis in extremities Lymphatic:  No palpable cervical or axillary lymphadenopathy Skin:  Warm, dry; no sign of jaundice Psychiatric - alert and oriented x 4; calm mood and affect     Labs, Imaging and Diagnostic Testing: CLINICAL DATA:  RUQ pain   EXAM: CT ABDOMEN AND PELVIS WITH CONTRAST   TECHNIQUE: Multidetector CT imaging of the abdomen and pelvis was performed using the standard protocol following bolus administration of intravenous contrast.   CONTRAST:  OMNIPAQUE IOHEXOL 300 MG/ML  SOLN   COMPARISON:  None.   FINDINGS: Lower chest: No acute abnormality.   Hepatobiliary: No focal liver abnormality is seen. No gallstones, gallbladder wall thickening, or biliary dilatation.   Pancreas: Unremarkable. No pancreatic ductal dilatation or surrounding inflammatory changes.   Spleen: Normal in size without focal abnormality.   Adrenals/Urinary Tract: Adrenal glands are unremarkable. Kidneys no hydronephrosis or nephrolithiasis. New nonspecific focal soft tissue seen adjacent to the mid and lower pole of the right kidney on series 2, image 33. Bilateral low-attenuation renal lesions, largest are compatible with simple cysts, others are too small to completely characterize. Left renal parenchymal calcification.  No nephrolithiasis. Bladder is unremarkable.   Stomach/Bowel: Stomach is within normal limits. Appendix appears normal. No evidence of bowel wall thickening, distention, or inflammatory changes.   Vascular/Lymphatic: Aortic atherosclerosis. No enlarged abdominal or pelvic lymph nodes.   Reproductive: Prostatomegaly, measuring up to 6.5 cm.   Other: Small bilateral fat containing inguinal hernias. No abdominopelvic ascites.   Musculoskeletal: No acute or significant osseous findings.   IMPRESSION: 1. No acute findings in the abdomen  or pelvis. 2. New nonspecific focal soft tissue and calcification of the pararenal fat located adjacent to the mid and lower pole of the right kidney, possibly sequela of prior infection or rupture of a simple cyst. 3. Aortic Atherosclerosis (ICD10-I70.0).     Electronically Signed   By: Allegra Lai M.D.   On: 06/01/2021 15:10     Assessment and Plan:  Diagnoses and all orders for this visit:   Chronic cholecystitis       Recommend laparoscopic cholecystectomy with intraoperative cholangiogram.The surgical procedure has been discussed with the patient.  Potential risks, benefits, alternative treatments, and expected outcomes have been explained.  All of the patient's questions at this time have been answered.  The likelihood of reaching the patient's treatment goal is good.  The patient understand the proposed surgical procedure and wishes to proceed.   Wilmon Arms. Corliss Skains, MD, [facility]  General Surgery   01/30/2023 7:18 AM

## 2023-01-31 ENCOUNTER — Encounter (HOSPITAL_COMMUNITY): Payer: Self-pay | Admitting: Surgery

## 2023-02-06 LAB — SURGICAL PATHOLOGY

## 2023-04-03 IMAGING — CT CT ABD-PELV W/ CM
2 of 5 series · 16 of 46 positions shown, 18 images · IV contrast (APPLIED)
Comparison: None.

CLINICAL DATA: RUQ pain

EXAM:
CT ABDOMEN AND PELVIS WITH CONTRAST
TECHNIQUE: Multidetector CT imaging of the abdomen and pelvis was performed
using the standard protocol following bolus administration of
intravenous contrast.
CONTRAST:  100mL OMNIPAQUE IOHEXOL 300 MG/ML  SOLN

[Series 2: abd pel w · axial · 0.79mm/px · z∈[+535,+935]mm · 13 of 91 slices shown, 15 images]
[im 6/91  soft-tissue]
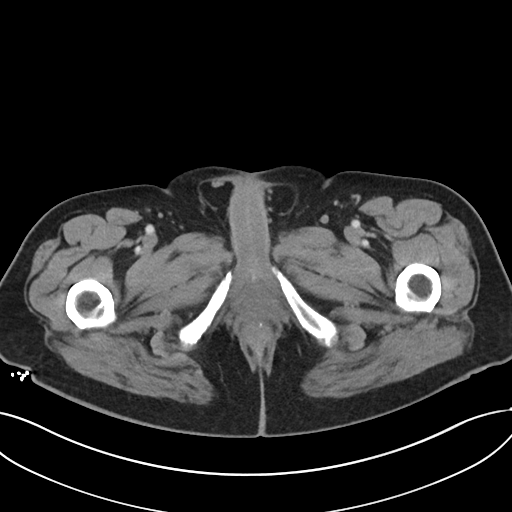
[im 6/91  bone]
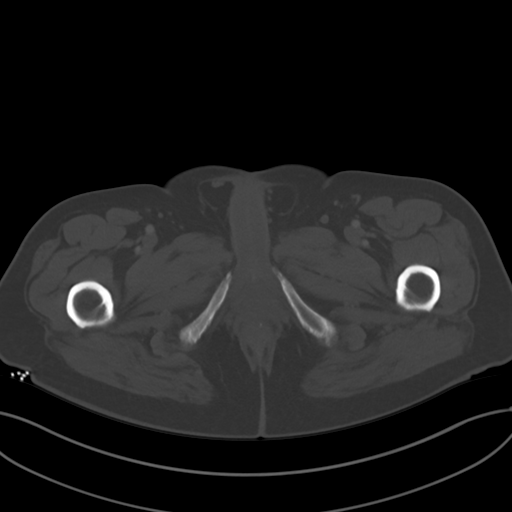
[im 11/91  soft-tissue]
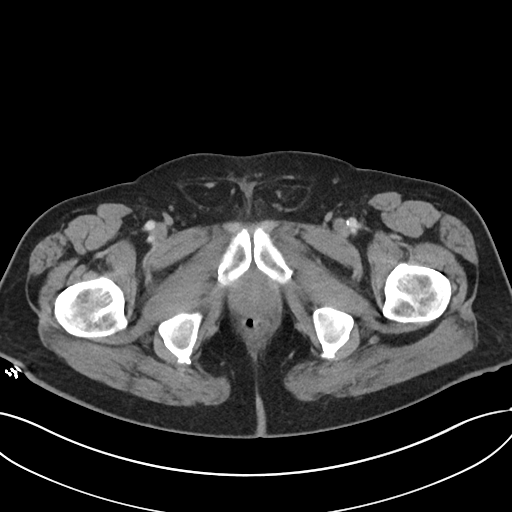
[im 21/91  soft-tissue]
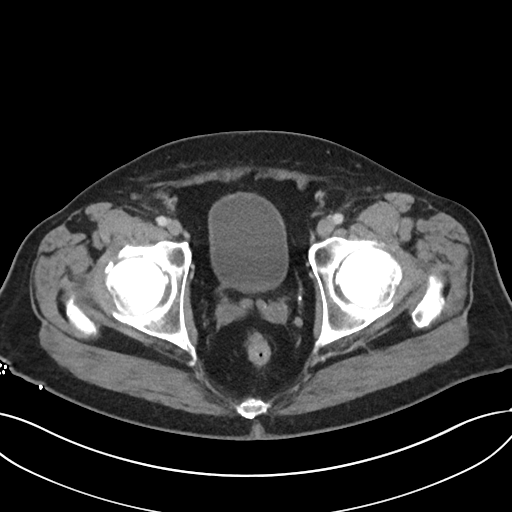
[im 26/91  soft-tissue]
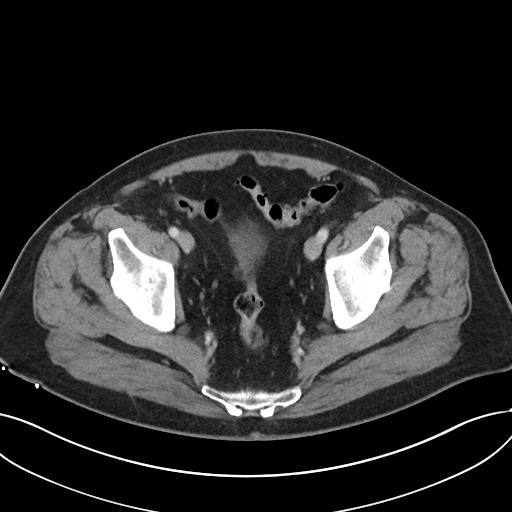
[im 31/91  soft-tissue]
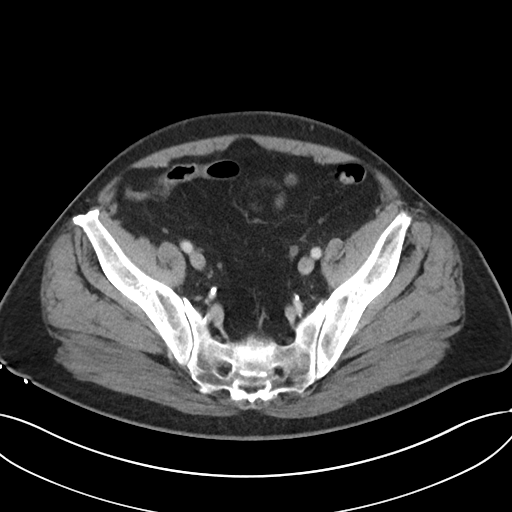
[im 41/91  soft-tissue]
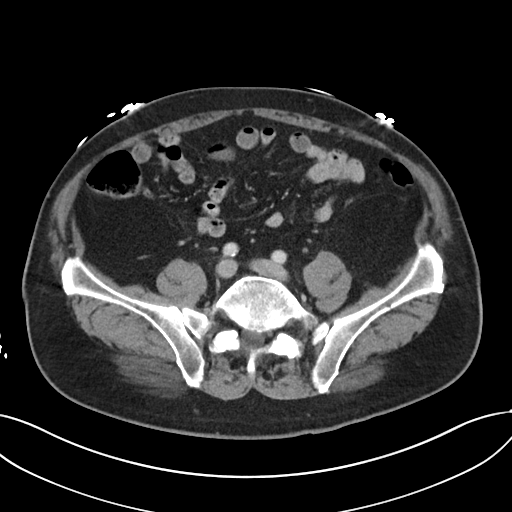
[im 46/91  soft-tissue]
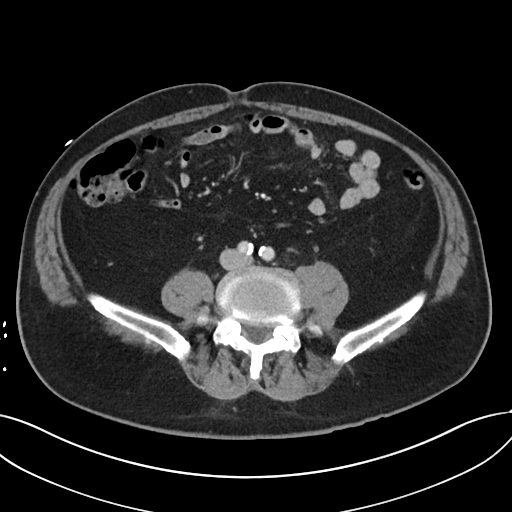
[im 51/91  soft-tissue]
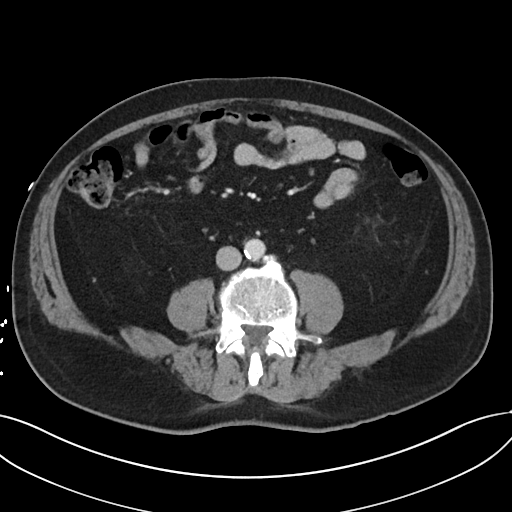
[im 61/91  soft-tissue]
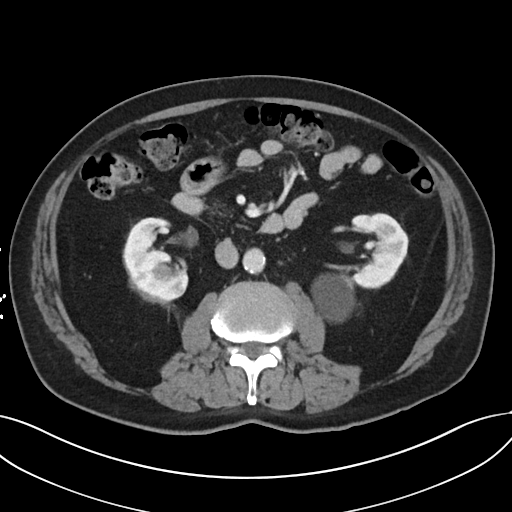
[im 61/91  bone]
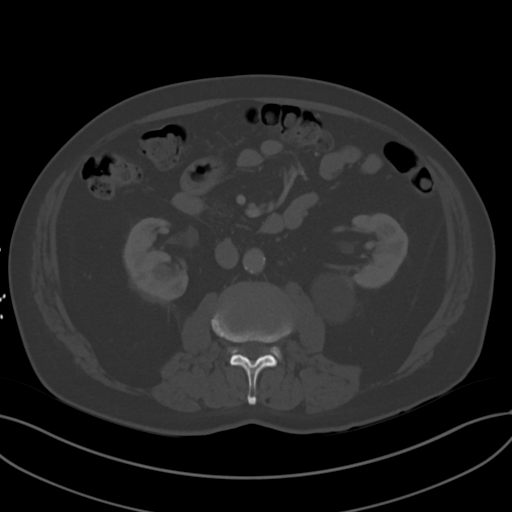
[im 66/91  soft-tissue]
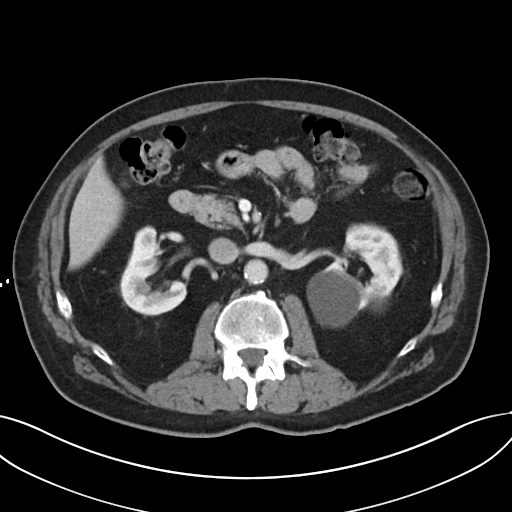
[im 71/91  soft-tissue]
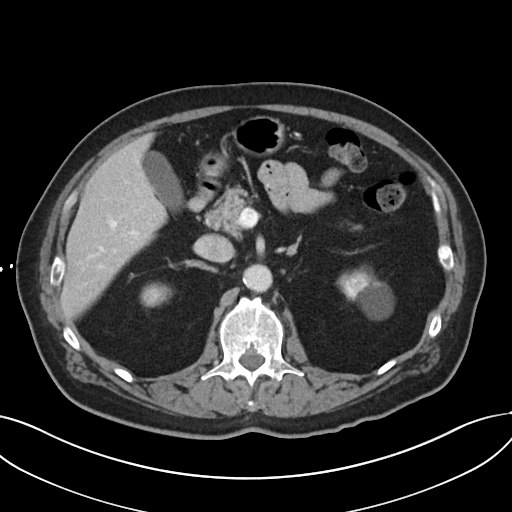
[im 81/91  soft-tissue]
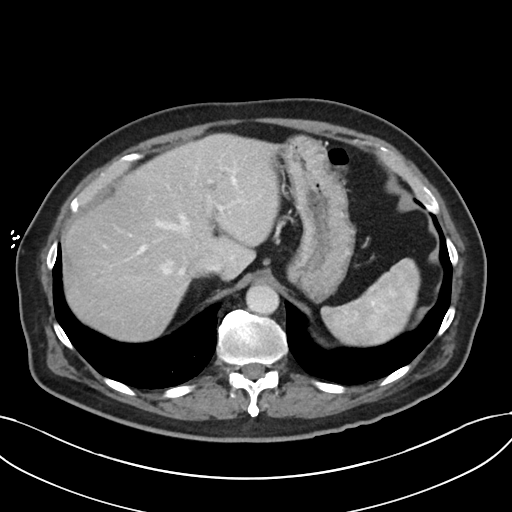
[im 86/91  soft-tissue]
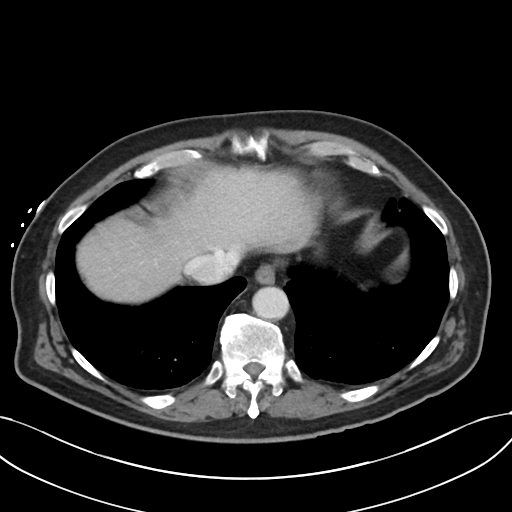

[Series 5: coronal · coronal · 0.89mm/px · 3 of 98 slices shown]
[im 33/98  soft-tissue]
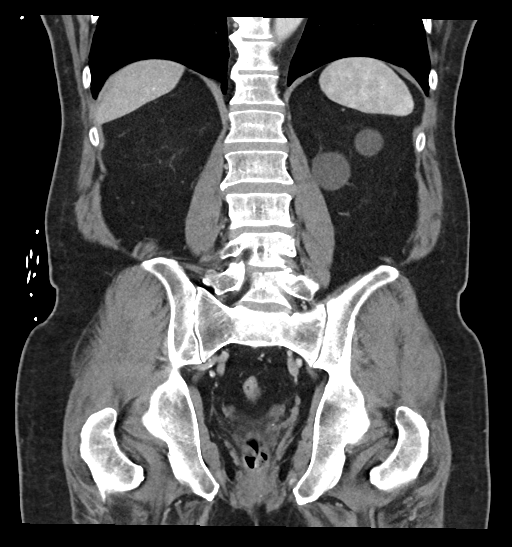
[im 44/98  soft-tissue]
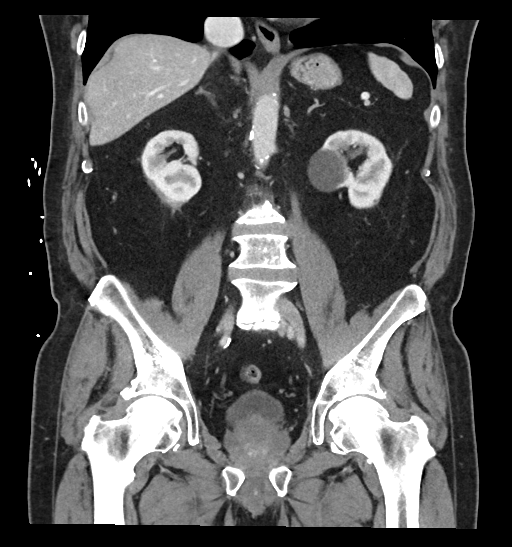
[im 54/98  soft-tissue]
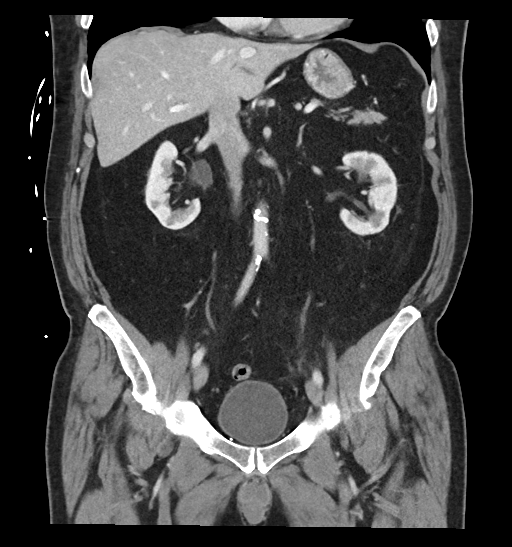

[16 of 46 positions shown; findings below may reference images not displayed]

FINDINGS: Lower chest: No acute abnormality.

Hepatobiliary: No focal liver abnormality is seen. No gallstones,
gallbladder wall thickening, or biliary dilatation.

Pancreas: Unremarkable. No pancreatic ductal dilatation or
surrounding inflammatory changes.

Spleen: Normal in size without focal abnormality.

Adrenals/Urinary Tract: Adrenal glands are unremarkable. Kidneys no
hydronephrosis or nephrolithiasis. New nonspecific focal soft tissue
seen adjacent to the mid and lower pole of the right kidney on
series 2, image 33. Bilateral low-attenuation renal lesions, largest
are compatible with simple cysts, others are too small to completely
characterize. Left renal parenchymal calcification. No
nephrolithiasis. Bladder is unremarkable.

Stomach/Bowel: Stomach is within normal limits. Appendix appears
normal. No evidence of bowel wall thickening, distention, or
inflammatory changes.

Vascular/Lymphatic: Aortic atherosclerosis. No enlarged abdominal or
pelvic lymph nodes.

Reproductive: Prostatomegaly, measuring up to 6.5 cm.

Other: Small bilateral fat containing inguinal hernias. No
abdominopelvic ascites.

Musculoskeletal: No acute or significant osseous findings.
IMPRESSION: 1. No acute findings in the abdomen or pelvis.
2. New nonspecific focal soft tissue and calcification of the
pararenal fat located adjacent to the mid and lower pole of the
right kidney, possibly sequela of prior infection or rupture of a
simple cyst.
3. Aortic Atherosclerosis ([id number]).

## 2023-05-08 DIAGNOSIS — M778 Other enthesopathies, not elsewhere classified: Secondary | ICD-10-CM | POA: Diagnosis not present

## 2023-05-08 DIAGNOSIS — M79671 Pain in right foot: Secondary | ICD-10-CM | POA: Diagnosis not present

## 2023-06-09 ENCOUNTER — Ambulatory Visit: Payer: Medicare Other | Attending: Cardiovascular Disease | Admitting: Cardiovascular Disease

## 2023-06-09 DIAGNOSIS — E785 Hyperlipidemia, unspecified: Secondary | ICD-10-CM

## 2023-06-09 DIAGNOSIS — I2510 Atherosclerotic heart disease of native coronary artery without angina pectoris: Secondary | ICD-10-CM

## 2023-06-09 DIAGNOSIS — I10 Essential (primary) hypertension: Secondary | ICD-10-CM

## 2023-06-09 DIAGNOSIS — I77810 Thoracic aortic ectasia: Secondary | ICD-10-CM

## 2023-06-09 NOTE — Assessment & Plan Note (Deleted)
 Patient has been treated with hydrochlorothiazide, carvedilol, and losartan

## 2023-06-09 NOTE — Progress Notes (Signed)
Patient no show for appointment.

## 2023-06-10 ENCOUNTER — Encounter: Payer: Self-pay | Admitting: Cardiovascular Disease

## 2023-07-17 ENCOUNTER — Ambulatory Visit
Payer: No Typology Code available for payment source | Attending: Cardiovascular Disease | Admitting: Cardiovascular Disease

## 2023-07-17 ENCOUNTER — Encounter: Payer: Self-pay | Admitting: Cardiovascular Disease

## 2023-07-17 VITALS — BP 140/82 | HR 49 | Ht 66.0 in | Wt 181.2 lb

## 2023-07-17 DIAGNOSIS — I2510 Atherosclerotic heart disease of native coronary artery without angina pectoris: Secondary | ICD-10-CM | POA: Diagnosis not present

## 2023-07-17 DIAGNOSIS — E785 Hyperlipidemia, unspecified: Secondary | ICD-10-CM | POA: Diagnosis not present

## 2023-07-17 DIAGNOSIS — I10 Essential (primary) hypertension: Secondary | ICD-10-CM | POA: Diagnosis not present

## 2023-07-17 NOTE — Progress Notes (Signed)
Cardiology Office Note:    Date:  07/17/2023   ID:  [name], [name] 11-29-1945, MRN [id number]  PCP:  Hunter Doom, MD   [facility] Providers Cardiologist:  Hunter Bollman, MD     Referring MD: Hunter Doom, MD   Chief Complaint  Patient presents with   Coronary Artery Disease    History of Present Illness:    [name] is a 78 y.o. male with a hx of coronary artery disease, presenting for follow-up evaluation. He initially presented with an anterior wall MI complicated by cardiac arrest in 2012 treated with a bare-metal stent to the LAD. He had acute coronary syndrome in 2013 and underwent stenting to the proximal LAD for in-stent restenosis with a DES.  Nuclear scanning in  2013 demonstrated old apical infarct and no ischemia EF 56%. 2-D echo confirmed normal LV function EF 55-60% with possible hypokinesis of the basal anterolateral and inferior septal walls.  DAPT therapy was discontinued in 2016 because of severe epistaxis. He has been maintained on low-dose aspirin.    The patient is here alone today.  He brings in lab work through the [place] system.  Sodium is 139, potassium 4.4, creatinine 1.24, glucose 117, ALT 24, AST 12, alkaline phosphatase 65, bilirubin 1.0, cholesterol 151, triglycerides 94, HDL 62, LDL 70. He's doing well overall. He had a cholecystectomy last August with no complications. 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 Today, he denies symptoms of palpitations, chest pain, shortness of breath, orthopnea, PND, lower extremity edema, dizziness, or syncope.  Current Medications: Current Meds  Medication Sig   aspirin 81 MG chewable tablet Chew 81 mg by mouth every morning.   carvedilol (COREG) 12.5 MG tablet Take 6.25 mg by mouth 2 (two) times daily with a meal.   hydrochlorothiazide (HYDRODIURIL) 25 MG tablet Take 12.5 mg by mouth in the morning.   loratadine (CLARITIN) 10 MG tablet Take 10 mg by mouth daily as needed (allergies.).    losartan (COZAAR) 100 MG tablet Take 50 mg by mouth in the morning.   Multiple Vitamin (MULTIVITAMIN WITH MINERALS) TABS Take 1 tablet by mouth every other day.   Multiple Vitamins-Minerals (PRESERVISION AREDS 2 PO) Take 1 tablet by mouth in the morning and at bedtime.   nitroGLYCERIN (NITROSTAT) 0.4 MG SL tablet Place 0.4 mg under the tongue every 5 (five) minutes x 3 doses as needed for chest pain.   omeprazole (PRILOSEC) 40 MG capsule Take 40 mg by mouth daily.   pravastatin (PRAVACHOL) 40 MG tablet Take 40 mg by mouth at bedtime.    [DISCONTINUED] oxyCODONE (OXY IR/ROXICODONE) 5 MG immediate release tablet Take 1 tablet (5 mg total) by mouth every 6 (six) hours as needed for severe pain.   [DISCONTINUED] pantoprazole (PROTONIX) 40 MG tablet Take 40 mg by mouth daily in the afternoon.     Allergies:   Lipitor [atorvastatin calcium] and Simvastatin   ROS:   Please see the history of present illness.    Positive for low back pain.  All other systems reviewed and are negative.  EKGs/Labs/Other Studies Reviewed:    The following studies were reviewed today: Cardiac Studies & Procedures   ______________________________________________________________________________________________   STRESS TESTS  MYOCARDIAL PERFUSION IMAGING 09/22/2019  Narrative  The left ventricular ejection fraction is normal (55-65%).  Nuclear stress EF: 56%.  There was no ST segment deviation noted during stress.  Low risk stress nuclear study with small fixed apical and  basal inferoseptal perfusion abnormalities, but without reversible ischemia. Normal left ventricular global systolic function.  The perfusion abnormalities are unchanged from 2018 (although the inferoseptal defect was not reported at that time).   ECHOCARDIOGRAM  ECHOCARDIOGRAM COMPLETE 06/13/2021  Narrative ECHOCARDIOGRAM REPORT    Patient Name:   [name] Date of Exam: 06/13/2021 Medical Rec #:  [id number]     Height:        66.0 in Accession #:    [id number]    Weight:       184.0 lb Date of Birth:  1946-01-03     BSA:          1.930 m Patient Age:    75 years      BP:           120/70 mmHg Patient Gender: M             HR:           62 bpm. 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 Previous Myocardial Infarction; Risk Factors:Hypertension and Dyslipidemia.  Sonographer:    Sedonia Small Rodgers-Jones RDCS Referring Phys: [id number] DAYNA N DUNN  IMPRESSIONS   1. Left ventricular ejection fraction, by estimation, is 50 to 55%. The left ventricle has low normal function. The left ventricle demonstrates regional wall motion abnormalities with akinesis of the basal inferoseptal and basal anteroseptal wall segments and hypokinesis of the apical cap. Left ventricular diastolic parameters are consistent with Grade I diastolic dysfunction (impaired relaxation). 2. Right ventricular systolic function is normal. The right ventricular size is normal. Tricuspid regurgitation signal is inadequate for assessing PA pressure. 3. The mitral valve is normal in structure. No evidence of mitral valve regurgitation. No evidence of mitral stenosis. 4. The aortic valve is tricuspid. Aortic valve regurgitation is not visualized. Aortic valve sclerosis/calcification is present, without any evidence of aortic stenosis. 5. Aortic dilatation noted. There is mild dilatation of the ascending aorta, measuring 39 mm. 6. The inferior vena cava is normal in size with greater than 50% respiratory variability, suggesting right atrial pressure of 3 mmHg.  FINDINGS Left Ventricle: Left ventricular ejection fraction, by estimation, is 50 to 55%. The left ventricle has low normal function. The left ventricle demonstrates regional wall motion abnormalities. The left  ventricular internal cavity size was normal in size. There is no left ventricular hypertrophy. Left ventricular diastolic parameters are consistent with Grade I diastolic dysfunction (impaired relaxation).  Right Ventricle: The right ventricular size is normal. No increase in right ventricular wall thickness. Right ventricular systolic function is normal. Tricuspid regurgitation signal is inadequate for assessing PA pressure.  Left Atrium: Left atrial size was normal in size.  Right Atrium: Right atrial size was normal in size.  Pericardium: There is no evidence of pericardial effusion.  Mitral Valve: The mitral valve is normal in structure. No evidence of mitral valve regurgitation. No evidence of mitral valve stenosis.  Tricuspid Valve: The tricuspid valve is normal in structure. Tricuspid valve regurgitation is trivial.  Aortic Valve: The aortic valve is tricuspid. Aortic valve regurgitation is not visualized. Aortic valve sclerosis/calcification is present, without any evidence of aortic stenosis.  Pulmonic Valve: The pulmonic valve was normal in structure. Pulmonic valve regurgitation is not visualized.  Aorta: The aortic root is normal in size and structure  and aortic dilatation noted. There is mild dilatation of the ascending aorta, measuring 39 mm.  Venous: The inferior vena cava is normal in size with greater than 50% respiratory variability, suggesting right atrial pressure of 3 mmHg.  IAS/Shunts: No atrial level shunt detected by color flow Doppler.   LEFT VENTRICLE PLAX 2D LVIDd:         5.40 cm   Diastology LVIDs:         3.70 cm   LV e' medial:    5.59 cm/s LV PW:         0.70 cm   LV E/e' medial:  10.8 LV IVS:        0.70 cm   LV e' lateral:   7.83 cm/s LVOT diam:     1.90 cm   LV E/e' lateral: 7.7 LV SV:         49 LV SV Index:   25 LVOT Area:     2.84 cm   RIGHT VENTRICLE RV Basal diam:  3.70 cm RV S prime:     15.70 cm/s TAPSE (M-mode): 2.0 cm  LEFT  ATRIUM             Index        RIGHT ATRIUM           Index LA diam:        3.80 cm 1.97 cm/m   RA Area:     11.20 cm LA Vol (A2C):   32.1 ml 16.63 ml/m  RA Volume:   27.60 ml  14.30 ml/m LA Vol (A4C):   21.4 ml 11.09 ml/m LA Biplane Vol: 28.4 ml 14.71 ml/m AORTIC VALVE LVOT Vmax:   73.10 cm/s LVOT Vmean:  48.700 cm/s LVOT VTI:    0.172 m  AORTA Ao Root diam: 3.20 cm Ao Asc diam:  3.90 cm  MITRAL VALVE MV Area (PHT): 3.12 cm    SHUNTS MV Decel Time: 243 msec    Systemic VTI:  0.17 m MV E velocity: 60.30 cm/s  Systemic Diam: 1.90 cm MV A velocity: 94.90 cm/s MV E/A ratio:  0.64  Dalton McleanMD Electronically signed by Wilfred Lacy Signature Date/Time: 06/13/2021/4:07:23 PM    Final          ______________________________________________________________________________________________      EKG:   EKG Interpretation Date/Time:  Thursday July 17 2023 09:11:50 EST Ventricular Rate:  49 PR Interval:  160 QRS Duration:  78 QT Interval:  430 QTC Calculation: 388 R Axis:   78  Text Interpretation: Sinus bradycardia Low voltage QRS Nonspecific T wave abnormality When compared with ECG of 26-Sep-2019 17:43, No significant change was found Confirmed by [name] [phone]) on 07/17/2023 9:26:07 AM    Recent Labs: 01/20/2023: BUN 16; Creatinine, Ser 1.12; Hemoglobin 14.7; Platelets 241; Potassium 4.3; Sodium 140  Recent Lipid Panel    Component Value Date/Time   CHOL  07/26/2010 0444    116        ATP III CLASSIFICATION:  <200     mg/dL   Desirable  604-540  mg/dL   Borderline High  >=981    mg/dL   High          TRIG 191 07/26/2010 0444   HDL 35 (L) 07/26/2010 0444   CHOLHDL 3.3 07/26/2010 0444   VLDL 27 07/26/2010 0444   LDLCALC  07/26/2010 0444    54        Total Cholesterol/HDL:CHD Risk Coronary Heart Disease Risk Table  Men   Women  1/2 Average Risk   3.4   3.3  Average Risk       5.0   4.4  2 X Average Risk   9.6    7.1  3 X Average Risk  23.4   11.0        Use the calculated Patient Ratio above and the CHD Risk Table to determine the patient's CHD Risk.        ATP III CLASSIFICATION (LDL):  <100     mg/dL   Optimal  098-119  mg/dL   Near or Above                    Optimal  130-159  mg/dL   Borderline  147-829  mg/dL   High  >562     mg/dL   Very High         Physical Exam:    VS:  BP (!) 140/82   Pulse (!) 49   Ht 5\' 6"  (1.676 m)   Wt 181 lb 3.2 oz (82.2 kg)   SpO2 99%   BMI 29.25 kg/m     Wt Readings from Last 3 Encounters:  07/17/23 181 lb 3.2 oz (82.2 kg)  01/30/23 174 lb (78.9 kg)  01/20/23 174 lb (78.9 kg)     GEN:  Well nourished, well developed in no acute distress HEENT: Normal NECK: No JVD; No carotid bruits LYMPHATICS: No lymphadenopathy CARDIAC: RRR, no murmurs, rubs, gallops RESPIRATORY:  Clear to auscultation without rales, wheezing or rhonchi  ABDOMEN: Soft, non-tender, non-distended MUSCULOSKELETAL:  No edema; No deformity  SKIN: Warm and dry NEUROLOGIC:  Alert and oriented x 3 PSYCHIATRIC:  Normal affect   Assessment & Plan Coronary artery disease involving native coronary artery of native heart without angina pectoris The patient is doing well with no symptoms of angina.  He will continue on his current medical regimen which includes aspirin for antiplatelet therapy, carvedilol, and pravastatin.  He is now 12 years out from his initial MI and 11 years out from his drug-eluting stent implantation for treatment of in-stent restenosis of the proximal LAD.  He has had no interval ischemic events. Essential hypertension Blood pressure is controlled on losartan, hydrochlorothiazide, and carvedilol.  Labs are reviewed as outlined in the HPI. Hyperlipidemia LDL goal <70 LDL cholesterol is 70 on pravastatin.  He has been intolerant of other statin drugs.  He remains clinically stable.  His LFTs are normal.     Medication Adjustments/Labs and Tests  Ordered: Current medicines are reviewed at length with the patient today.  Concerns regarding medicines are outlined above.  Orders Placed This Encounter  Procedures   EKG 12-Lead   No orders of the defined types were placed in this encounter.   Patient Instructions  Follow-Up: At [facility], you and your health needs are our priority.  As part of our continuing mission to provide you with exceptional heart care, we have created designated Provider Care Teams.  These Care Teams include your primary Cardiologist (physician) and Advanced Practice Providers (APPs -  Physician Assistants and Nurse Practitioners) who all work together to provide you with the care you need, when you need it.  We recommend signing up for the patient portal called "MyChart".  Sign up information is provided on this After Visit Summary.  MyChart is used to connect with patients for Virtual Visits (Telemedicine).  Patients are able to view lab/test results, encounter notes, upcoming appointments, etc.  Non-urgent messages can be sent to your provider as well.   To learn more about what you can do with MyChart, go to [email or web address].    Your next appointment:   1 year(s)  Provider:   Tonny Bollman, MD     Other Instructions Have labs drawn at the [facility] in the [place]    1st Floor: - Lobby - Registration  - Pharmacy  - Lab - Cafe  2nd Floor: - PV Lab - Diagnostic Testing (echo, CT, nuclear med)  3rd Floor: - Vacant  4th Floor: - TCTS (cardiothoracic surgery) - AFib Clinic - Structural Heart Clinic - Vascular Surgery  - Vascular Ultrasound  5th Floor: - HeartCare Cardiology (general and EP) - Clinical Pharmacy for coumadin, hypertension, lipid, weight-loss medications, and med management appointments    Valet parking services will be available as well.        Signed, Hunter Bollman, MD  07/17/2023 4:58 PM    [facility]

## 2023-07-17 NOTE — Assessment & Plan Note (Signed)
Blood pressure is controlled on losartan, hydrochlorothiazide, and carvedilol.  Labs are reviewed as outlined in the HPI.

## 2023-07-17 NOTE — Patient Instructions (Signed)
Follow-Up: At [facility], you and your health needs are our priority.  As part of our continuing mission to provide you with exceptional heart care, we have created designated Provider Care Teams.  These Care Teams include your primary Cardiologist (physician) and Advanced Practice Providers (APPs -  Physician Assistants and Nurse Practitioners) who all work together to provide you with the care you need, when you need it.  We recommend signing up for the patient portal called "MyChart".  Sign up information is provided on this After Visit Summary.  MyChart is used to connect with patients for Virtual Visits (Telemedicine).  Patients are able to view lab/test results, encounter notes, upcoming appointments, etc.  Non-urgent messages can be sent to your provider as well.   To learn more about what you can do with MyChart, go to [email or web address].    Your next appointment:   1 year(s)  Provider:   Tonny Bollman, MD     Other Instructions Have labs drawn at the [facility] in the [place]    1st Floor: - Lobby - Registration  - Pharmacy  - Lab - Cafe  2nd Floor: - PV Lab - Diagnostic Testing (echo, CT, nuclear med)  3rd Floor: - Vacant  4th Floor: - TCTS (cardiothoracic surgery) - AFib Clinic - Structural Heart Clinic - Vascular Surgery  - Vascular Ultrasound  5th Floor: - HeartCare Cardiology (general and EP) - Clinical Pharmacy for coumadin, hypertension, lipid, weight-loss medications, and med management appointments    Valet parking services will be available as well.

## 2023-12-23 DIAGNOSIS — M778 Other enthesopathies, not elsewhere classified: Secondary | ICD-10-CM | POA: Diagnosis not present

## 2023-12-23 DIAGNOSIS — M79671 Pain in right foot: Secondary | ICD-10-CM | POA: Diagnosis not present
